# Patient Record
Sex: Female | Born: 1965
Health system: Southern US, Community
[De-identification: ages and names within clinical notes are randomized; demographics above are authoritative.]

## PROBLEM LIST (undated history)

## (undated) DIAGNOSIS — I10 Essential (primary) hypertension: Secondary | ICD-10-CM

## (undated) DIAGNOSIS — Z90710 Acquired absence of both cervix and uterus: Secondary | ICD-10-CM

## (undated) DIAGNOSIS — D649 Anemia, unspecified: Secondary | ICD-10-CM

## (undated) HISTORY — PX: ABDOMINAL HYSTERECTOMY: SHX81

## (undated) HISTORY — DX: Essential (primary) hypertension: I10

## (undated) HISTORY — DX: Acquired absence of both cervix and uterus: Z90.710

---

## 1997-09-26 ENCOUNTER — Encounter: Admission: RE | Admit: 1997-09-26 | Discharge: 1997-09-26 | Payer: Self-pay | Admitting: Family Medicine

## 1997-12-20 ENCOUNTER — Encounter: Admission: RE | Admit: 1997-12-20 | Discharge: 1997-12-20 | Payer: Self-pay | Admitting: Family Medicine

## 2006-09-22 ENCOUNTER — Ambulatory Visit: Payer: Self-pay | Admitting: Oncology

## 2006-09-25 LAB — CBC WITH DIFFERENTIAL (CANCER CENTER ONLY)
BASO#: 0 10*3/uL (ref 0.0–0.2)
Eosinophils Absolute: 0.1 10*3/uL (ref 0.0–0.5)
HGB: 8.4 g/dL — ABNORMAL LOW (ref 11.6–15.9)
LYMPH#: 2.6 10*3/uL (ref 0.9–3.3)
MONO#: 0.4 10*3/uL (ref 0.1–0.9)
NEUT#: 3.8 10*3/uL (ref 1.5–6.5)
RBC: 5 10*6/uL (ref 3.70–5.32)

## 2006-09-25 LAB — MORPHOLOGY - CHCC SATELLITE

## 2006-09-26 LAB — COMPREHENSIVE METABOLIC PANEL
AST: 22 U/L (ref 0–37)
Albumin: 3 g/dL — ABNORMAL LOW (ref 3.5–5.2)
BUN: 5 mg/dL — ABNORMAL LOW (ref 6–23)
Calcium: 8.5 mg/dL (ref 8.4–10.5)
Chloride: 102 mEq/L (ref 96–112)
Glucose, Bld: 128 mg/dL — ABNORMAL HIGH (ref 70–99)
Potassium: 3.3 mEq/L — ABNORMAL LOW (ref 3.5–5.3)

## 2006-09-26 LAB — FOLATE: Folate: 5.5 ng/mL

## 2006-09-26 LAB — IRON AND TIBC: UIBC: 488 ug/dL

## 2006-09-29 ENCOUNTER — Encounter (HOSPITAL_COMMUNITY): Admission: RE | Admit: 2006-09-29 | Discharge: 2006-12-09 | Payer: Self-pay | Admitting: Oncology

## 2006-09-29 LAB — BASIC METABOLIC PANEL - CANCER CENTER ONLY
BUN, Bld: 9 mg/dL (ref 7–22)
Chloride: 100 mEq/L (ref 98–108)
Potassium: 4.3 mEq/L (ref 3.3–4.7)

## 2006-09-30 LAB — TYPE & CROSSMATCH - CHCC SATELLITE

## 2006-10-08 LAB — BASIC METABOLIC PANEL - CANCER CENTER ONLY
BUN, Bld: 10 mg/dL (ref 7–22)
Calcium: 8.9 mg/dL (ref 8.0–10.3)
Chloride: 102 mEq/L (ref 98–108)
Creat: 0.5 mg/dl — ABNORMAL LOW (ref 0.6–1.2)

## 2006-10-08 LAB — CBC WITH DIFFERENTIAL (CANCER CENTER ONLY)
BASO#: 0 10*3/uL (ref 0.0–0.2)
Eosinophils Absolute: 0.2 10*3/uL (ref 0.0–0.5)
HGB: 11.1 g/dL — ABNORMAL LOW (ref 11.6–15.9)
LYMPH%: 30 % (ref 14.0–48.0)
MCH: 18.8 pg — ABNORMAL LOW (ref 26.0–34.0)
MCV: 61 fL — ABNORMAL LOW (ref 81–101)
MONO%: 5.6 % (ref 0.0–13.0)
RBC: 5.91 10*6/uL — ABNORMAL HIGH (ref 3.70–5.32)

## 2006-10-08 LAB — RETICULOCYTES (CHCC)
ABS Retic: 17.8 10*3/uL — ABNORMAL LOW (ref 19.0–186.0)
RBC.: 5.94 MIL/uL — ABNORMAL HIGH (ref 3.87–5.11)

## 2006-10-14 ENCOUNTER — Ambulatory Visit (HOSPITAL_COMMUNITY): Admission: RE | Admit: 2006-10-14 | Discharge: 2006-10-15 | Payer: Self-pay | Admitting: Obstetrics and Gynecology

## 2006-10-14 ENCOUNTER — Encounter (INDEPENDENT_AMBULATORY_CARE_PROVIDER_SITE_OTHER): Payer: Self-pay | Admitting: Specialist

## 2006-10-21 LAB — CBC WITH DIFFERENTIAL (CANCER CENTER ONLY)
Eosinophils Absolute: 0.3 10*3/uL (ref 0.0–0.5)
HCT: 34.9 % (ref 34.8–46.6)
LYMPH%: 30.8 % (ref 14.0–48.0)
MCH: 19 pg — ABNORMAL LOW (ref 26.0–34.0)
MCV: 61 fL — ABNORMAL LOW (ref 81–101)
MONO%: 8.1 % (ref 0.0–13.0)
NEUT%: 56.8 % (ref 39.6–80.0)
Platelets: 496 10*3/uL — ABNORMAL HIGH (ref 145–400)
RBC: 5.76 10*6/uL — ABNORMAL HIGH (ref 3.70–5.32)
RDW: 19.5 % — ABNORMAL HIGH (ref 10.5–14.6)

## 2006-10-21 LAB — IRON AND TIBC
%SAT: 6 % — ABNORMAL LOW (ref 20–55)
TIBC: 517 ug/dL — ABNORMAL HIGH (ref 250–470)

## 2006-11-07 ENCOUNTER — Ambulatory Visit: Payer: Self-pay | Admitting: Oncology

## 2006-11-25 LAB — CBC WITH DIFFERENTIAL (CANCER CENTER ONLY)
BASO%: 0.5 % (ref 0.0–2.0)
EOS%: 2.1 % (ref 0.0–7.0)
LYMPH#: 2.2 10*3/uL (ref 0.9–3.3)
MCHC: 31.3 g/dL — ABNORMAL LOW (ref 32.0–36.0)
MONO#: 0.6 10*3/uL (ref 0.1–0.9)
NEUT#: 4.3 10*3/uL (ref 1.5–6.5)
Platelets: 376 10*3/uL (ref 145–400)
RDW: 22.5 % — ABNORMAL HIGH (ref 10.5–14.6)
WBC: 7.3 10*3/uL (ref 3.9–10.0)

## 2006-12-22 ENCOUNTER — Ambulatory Visit: Payer: Self-pay | Admitting: Oncology

## 2007-01-06 ENCOUNTER — Encounter: Payer: Self-pay | Admitting: Internal Medicine

## 2007-01-06 LAB — CBC WITH DIFFERENTIAL (CANCER CENTER ONLY)
BASO#: 0 10*3/uL (ref 0.0–0.2)
BASO%: 0.4 % (ref 0.0–2.0)
EOS%: 2.7 % (ref 0.0–7.0)
HCT: 35.9 % (ref 34.8–46.6)
HGB: 11.3 g/dL — ABNORMAL LOW (ref 11.6–15.9)
LYMPH#: 2.3 10*3/uL (ref 0.9–3.3)
MCHC: 31.6 g/dL — ABNORMAL LOW (ref 32.0–36.0)
MONO#: 0.4 10*3/uL (ref 0.1–0.9)
NEUT#: 3.6 10*3/uL (ref 1.5–6.5)
NEUT%: 56 % (ref 39.6–80.0)
WBC: 6.4 10*3/uL (ref 3.9–10.0)

## 2007-01-06 LAB — FERRITIN: Ferritin: 191 ng/mL (ref 10–291)

## 2007-01-06 LAB — IRON AND TIBC
Iron: 110 ug/dL (ref 42–145)
TIBC: 368 ug/dL (ref 250–470)
UIBC: 258 ug/dL

## 2007-03-27 ENCOUNTER — Ambulatory Visit: Payer: Self-pay | Admitting: Oncology

## 2007-03-31 LAB — CBC WITH DIFFERENTIAL (CANCER CENTER ONLY)
BASO%: 0.4 % (ref 0.0–2.0)
EOS%: 2.8 % (ref 0.0–7.0)
LYMPH%: 39.5 % (ref 14.0–48.0)
MCH: 20 pg — ABNORMAL LOW (ref 26.0–34.0)
MCHC: 31.7 g/dL — ABNORMAL LOW (ref 32.0–36.0)
MCV: 63 fL — ABNORMAL LOW (ref 81–101)
MONO#: 0.4 10*3/uL (ref 0.1–0.9)
MONO%: 7.7 % (ref 0.0–13.0)
NEUT%: 49.6 % (ref 39.6–80.0)
Platelets: 340 10*3/uL (ref 145–400)
RDW: 15.5 % — ABNORMAL HIGH (ref 10.5–14.6)
WBC: 5.3 10*3/uL (ref 3.9–10.0)

## 2007-03-31 LAB — IRON AND TIBC
%SAT: 31 % (ref 20–55)
Iron: 114 ug/dL (ref 42–145)
UIBC: 252 ug/dL

## 2007-03-31 LAB — FERRITIN: Ferritin: 230 ng/mL (ref 10–291)

## 2007-06-17 ENCOUNTER — Ambulatory Visit: Payer: Self-pay | Admitting: Oncology

## 2007-06-23 LAB — CBC WITH DIFFERENTIAL (CANCER CENTER ONLY)
BASO#: 0 10*3/uL (ref 0.0–0.2)
BASO%: 0.5 % (ref 0.0–2.0)
HCT: 35.3 % (ref 34.8–46.6)
HGB: 11.1 g/dL — ABNORMAL LOW (ref 11.6–15.9)
LYMPH#: 2.7 10*3/uL (ref 0.9–3.3)
MONO#: 0.5 10*3/uL (ref 0.1–0.9)
NEUT#: 3.8 10*3/uL (ref 1.5–6.5)
NEUT%: 53.5 % (ref 39.6–80.0)
WBC: 7.2 10*3/uL (ref 3.9–10.0)

## 2007-06-23 LAB — FERRITIN: Ferritin: 169 ng/mL (ref 10–291)

## 2007-06-23 LAB — MORPHOLOGY - CHCC SATELLITE: PLT EST ~~LOC~~: ADEQUATE

## 2007-06-23 LAB — RETICULOCYTES (CHCC): Retic Ct Pct: 1.9 % (ref 0.4–3.1)

## 2007-06-23 LAB — IRON AND TIBC: Iron: 106 ug/dL (ref 42–145)

## 2007-06-25 LAB — HEMOGLOBINOPATHY EVALUATION
Hemoglobin Other: 0 % (ref 0.0–0.0)
Hgb F Quant: 0.4 % (ref 0.0–2.0)
Hgb S Quant: 0 % (ref 0.0–0.0)

## 2007-12-21 ENCOUNTER — Ambulatory Visit: Payer: Self-pay | Admitting: Oncology

## 2007-12-22 LAB — CBC WITH DIFFERENTIAL (CANCER CENTER ONLY)
BASO#: 0 10*3/uL (ref 0.0–0.2)
BASO%: 0.4 % (ref 0.0–2.0)
EOS%: 1.5 % (ref 0.0–7.0)
HGB: 10.6 g/dL — ABNORMAL LOW (ref 11.6–15.9)
LYMPH#: 1.8 10*3/uL (ref 0.9–3.3)
MCHC: 31.7 g/dL — ABNORMAL LOW (ref 32.0–36.0)
NEUT#: 3.5 10*3/uL (ref 1.5–6.5)
RDW: 16.2 % — ABNORMAL HIGH (ref 10.5–14.6)

## 2007-12-22 LAB — FERRITIN: Ferritin: 199 ng/mL (ref 10–291)

## 2007-12-22 LAB — IRON AND TIBC
%SAT: 33 % (ref 20–55)
TIBC: 314 ug/dL (ref 250–470)

## 2008-12-14 ENCOUNTER — Ambulatory Visit: Payer: Self-pay | Admitting: Oncology

## 2009-02-13 ENCOUNTER — Ambulatory Visit: Payer: Self-pay | Admitting: Oncology

## 2010-11-02 NOTE — Op Note (Signed)
NAMEMarland Grant  [name], [name] NO.:  [id number]   MEDICAL RECORD NO.:  [id number]          PATIENT TYPE:  OIB   LOCATION:  9308                          FACILITY:  [facility]   PHYSICIAN:  Maxie Better, M.D.DATE OF BIRTH:  22-May-1966   DATE OF PROCEDURE:  10/14/2006  DATE OF DISCHARGE:                               OPERATIVE REPORT   PREOPERATIVE DIAGNOSIS:  Menorrhagia, uterine fibroids, iron deficiency  anemia.   PROCEDURE:  Laparoscopic total hysterectomy.   POSTOPERATIVE DIAGNOSIS:  Menorrhagia, fibroid uterus, iron deficiency  anemia.   SURGEON:  Maxie Better, M.D.   ASSISTANT:  Gerri Spore B. Earlene Plater, M.D.   PROCEDURE:  Under adequate general anesthesia the patient is placed in  the dorsal lithotomy position.  She was sterilely prepped and draped in  usual fashion.  An indwelling Foley catheter was sterilely placed.  Examination under anesthesia revealed about a 8-10-week size uterus.  No  adnexal mass could be appreciated.  A weighted speculum placed in the  vagina.  Single tooth retractor was used anteriorly. Single-tooth  tenaculums placed on the anterior lip of the cervix.  The cervix sounded  to 8 cm. Cervix was then dilated up to #22 dilator.  A Rumi uterine  manipulator was then introduced into the uterine cavity in the usual  fashion and tenaculum removed.  Attention was then turned to the  abdomen. Quarter percent Marcaine was injected infraumbilically.  Infraumbilical incision was then made, carried down to the rectus  fascia.  Rectus fascia was opened bluntly and the parietal peritoneum  was entered bluntly and the pursestring 0 Vicryl placed on the fascia.  Hasson cannula was introduced and lighted video laparoscope was then  inserted after carbon dioxide was insufflated. The patient is placed in  Trendelenburg position. Two additional ports were then placed inferiorly  right and left. In the left lower in the left quadrant was 10-11 mm  scope  through an incision and 5 mm port was placed through the right  side. Using the lower ports the pelvis was inspected. Uterus was  globular with two pedunculated posterior fibroids.  Both ovaries were  normal. Both ureters were seen. There was slight bowel adhesions on the  left superior to the left adnexa. No other lesions and endometriosis  seen. Using the gyrus cutting forceps the round ligament, the utero-  ovarian ligaments were bilaterally cauterized.  The anterior leaf of  broad ligament was opened. The vesicouterine peritoneum was opened  transversely.  The bladder was sharply dissected off the uterine  segment.  The uterine vessels were cauterized after skeletonization with  the cone-tip traction and Rumi manipulator. The plasma spatula was then  utilized to open the vaginal cuff anteriorly and then the vaginal cuff  was then opened posteriorly. Continued cauterization of the uterine  vessels at that level was then performed and the uterus and cervix was  then severed from its vaginal attachment. The uterus was then brought  down into the vaginal area, however it needed to be morcellated in order  for it to be able to be passed through the vagina.  This was then  performed by removing the Rumi manipulator and uterus was removed in  pieces. The bigger portion of the uterus was then left in the vagina for  barrier. Attention was then turned again back to the abdominal process  and the vaginal cuff was closed with interrupted sutures of 0 Vicryl.  When the cuff was felt to be closed, the abdomen was irrigated,  suctioned, pedicles were inspected.  No bleeding noted and the lower  ports were removed. Abdomen was the deflated and the pursestring was  closed in the infraumbilical site and the skin incisions were  approximated with 4-0 Vicryl subcuticular stitch.  The specimen was also  removed from the vagina.  Specimen was uterus with cervix. Estimated blood loss was 100 mL.   Intraoperative fluid was 4 liters. Urine output was 300 mL clear yellow  urine.  Sponge and instrument counts x2 was correct.  Complications  none.  The patient tolerated procedure well and was transferred to  recovery in stable condition.      Maxie Better, M.D.  Electronically Signed     [place]/MED[facility]  D:  10/14/2006  T:  10/14/2006  Job:  [id number]

## 2011-04-22 ENCOUNTER — Other Ambulatory Visit (HOSPITAL_COMMUNITY): Payer: Self-pay | Admitting: Obstetrics and Gynecology

## 2011-05-10 ENCOUNTER — Other Ambulatory Visit (HOSPITAL_COMMUNITY): Payer: Self-pay | Admitting: Obstetrics and Gynecology

## 2011-05-10 DIAGNOSIS — Z1231 Encounter for screening mammogram for malignant neoplasm of breast: Secondary | ICD-10-CM

## 2011-06-13 ENCOUNTER — Ambulatory Visit (HOSPITAL_COMMUNITY)
Admission: RE | Admit: 2011-06-13 | Discharge: 2011-06-13 | Disposition: A | Discharge status: Home / Self Care | Payer: Self-pay | Source: Ambulatory Visit | Attending: Obstetrics and Gynecology | Admitting: Obstetrics and Gynecology

## 2011-06-13 DIAGNOSIS — Z1231 Encounter for screening mammogram for malignant neoplasm of breast: Secondary | ICD-10-CM

## 2011-06-25 ENCOUNTER — Other Ambulatory Visit: Payer: Self-pay | Admitting: Obstetrics and Gynecology

## 2011-06-25 DIAGNOSIS — R928 Other abnormal and inconclusive findings on diagnostic imaging of breast: Secondary | ICD-10-CM

## 2011-07-01 ENCOUNTER — Ambulatory Visit
Admission: RE | Admit: 2011-07-01 | Discharge: 2011-07-01 | Disposition: A | Discharge status: Home / Self Care | Payer: Self-pay | Source: Ambulatory Visit | Attending: Obstetrics and Gynecology | Admitting: Obstetrics and Gynecology

## 2011-07-01 DIAGNOSIS — R928 Other abnormal and inconclusive findings on diagnostic imaging of breast: Secondary | ICD-10-CM

## 2011-11-22 ENCOUNTER — Other Ambulatory Visit: Payer: Self-pay | Admitting: Obstetrics and Gynecology

## 2011-11-22 DIAGNOSIS — R921 Mammographic calcification found on diagnostic imaging of breast: Secondary | ICD-10-CM

## 2011-12-03 ENCOUNTER — Ambulatory Visit
Admission: RE | Admit: 2011-12-03 | Discharge: 2011-12-03 | Disposition: A | Discharge status: Home / Self Care | Payer: No Typology Code available for payment source | Source: Ambulatory Visit | Attending: Obstetrics and Gynecology | Admitting: Obstetrics and Gynecology

## 2011-12-03 ENCOUNTER — Ambulatory Visit
Admission: RE | Admit: 2011-12-03 | Discharge: 2011-12-03 | Disposition: A | Discharge status: Home / Self Care | Payer: No Typology Code available for payment source | Source: Ambulatory Visit

## 2011-12-03 ENCOUNTER — Other Ambulatory Visit: Payer: Self-pay | Admitting: Obstetrics and Gynecology

## 2011-12-03 DIAGNOSIS — R921 Mammographic calcification found on diagnostic imaging of breast: Secondary | ICD-10-CM

## 2011-12-31 ENCOUNTER — Encounter: Payer: Self-pay | Admitting: Family Medicine

## 2011-12-31 ENCOUNTER — Ambulatory Visit (INDEPENDENT_AMBULATORY_CARE_PROVIDER_SITE_OTHER): Payer: No Typology Code available for payment source | Admitting: Family Medicine

## 2011-12-31 VITALS — BP 116/78 | HR 70 | Temp 98.4°F | Ht 65.5 in | Wt 190.0 lb

## 2011-12-31 DIAGNOSIS — R61 Generalized hyperhidrosis: Secondary | ICD-10-CM | POA: Insufficient documentation

## 2011-12-31 DIAGNOSIS — I10 Essential (primary) hypertension: Secondary | ICD-10-CM | POA: Insufficient documentation

## 2011-12-31 DIAGNOSIS — L304 Erythema intertrigo: Secondary | ICD-10-CM | POA: Insufficient documentation

## 2011-12-31 DIAGNOSIS — L538 Other specified erythematous conditions: Secondary | ICD-10-CM

## 2011-12-31 MED ORDER — HYDROCHLOROTHIAZIDE 25 MG PO TABS: 25.0000 mg | ORAL_TABLET | Freq: Every day | ORAL | Status: DC

## 2011-12-31 MED ORDER — POTASSIUM CHLORIDE ER 10 MEQ PO TBCR: 10.0000 meq | EXTENDED_RELEASE_TABLET | Freq: Every day | ORAL | Status: DC

## 2011-12-31 MED ORDER — POTASSIUM CHLORIDE CRYS ER 10 MEQ PO TBCR: 10.0000 meq | EXTENDED_RELEASE_TABLET | Freq: Every day | ORAL | Status: DC

## 2011-12-31 NOTE — Assessment & Plan Note (Addendum)
Most likely due to menopause as pt still has her ovaries and experiences hot flashes during the day. No red flags (no fever, unexplained weight loss, cough, lymphadenopathy). Would have liked to get TSH, CBC/diff, and sed rate (threshold for action 60-70) today, but could not get labs due to financial concerns. If she is still experiencing these symptoms when she follows up in 3 months, will get the labs then as she should hopefully have [facility] card by then. Explained that if she gets any "red flag" symptoms before her 3 month follow up, she should return to clinic ASAP. She stated understanding.

## 2011-12-31 NOTE — Progress Notes (Addendum)
Patient ID: Kathryn Grant, female   DOB: 04-02-1966, 46 y.o.   MRN: [id number]   New patient visit today. Hx of hypertension, no other medical problems. She previously saw Dr. Maxie Better (OBGYN) and Dr. Della Goo as her PCP. Is changing doctors due to financial concerns.   HPI:  HYPERTENSION Takes HCTZ 25 mg for hypertension. No problems with medication. No chest pain, shortness of breath, vision changes, headaches, lightheadedness. Does get swelling in her left ankle if she uses salt on her food. Does not check her BP at home but does have a monitor. Needs refills on HCTZ and her of KCl that she takes every morning.  NIGHT SWEATS Have happened x1 month. No cough, fevers, or lumps on her body. Has intentionally lost weight over last few years, but no unexplained weight loss. Had hysterectomy in 2008 but still has ovaries. Does experience hot flashes during the day.  INTERTRIGO Occasionally gets "overheated" and feels sweaty and itchy under her breasts and on her sides around her underwear. Previous provider had given her clotrimazole/betamethasone and she has been applying this prn. Would like a refill. She does not have any symptoms right now.  ROS - See HPI  Social hx: nonsmoker, no alcohol   Health Maintenance: Has hx of abnormal mammogram, needs f/u diagnostic mammogram in January 2014.   EXAM: Gen: NAD, unusually pleasant Neck: No lymphadenopathy Heart: RRR Lungs: CTAB Abd: soft, nontender, nondistended, no masses Ext: no lower extremity edema, dorsal pedis pulses 2+ bilaterally Breasts: no inframammary redness or irritation present.

## 2011-12-31 NOTE — Assessment & Plan Note (Signed)
Not checking BP at home, but reading here in the office today is great. Will refill her HCTZ and KCl; desires a paper prescription. I have asked her to check and keep note of her blood pressures at home. She was hesitant when I mentioned possibly lowering her HCTZ dose if her blood pressures are good. Will continue with her current medications and f/u in 3 months. Needs to have BMET at that time; unable to get labs today due to financial concerns, is pursuing the [facility] card.

## 2011-12-31 NOTE — Assessment & Plan Note (Signed)
Occasionally has symptoms when she gets hot. Previously using clotrimazole/betamethasone cream. I explained that this is not a medication we would typically use for intertrigo, but suggested she try OTC 1% hydrocortisone cream, alternating with OTC clotrimazole cream. States understanding of this suggestion.

## 2011-12-31 NOTE — Progress Notes (Deleted)
Patient ID: Kathryn Grant, female   DOB: 20-Aug-1965, 46 y.o.   MRN: [id number]  New patient visit today. Hx of hypertension, no other medical problems. She previously saw Dr. Maxie Better (OBGYN) and Dr. Della Goo as her PCP. Is changing doctors due to financial concerns.  Social hx: nonsmoker, no alcohol  Has hx of abnormal mammogram, needs f/u diagnostic mammogram in January 2014.

## 2011-12-31 NOTE — Patient Instructions (Signed)
It was nice to meet you today. I have given you a prescription for your potassium and hydrochlorothiazide.  For the area under your breast, you can apply: 1% hydrocortisone cream Clotrimazole cream (the same kind used for a vaginal yeast infection) You can alternate these creams every 6 hours. These are available over the counter.  I think your night sweats are likely due to menopause. If you experience any unexplained weight loss, fevers, lumps, or cough that does not go away, I want you to come back to see me as soon as you can.  Make a follow up appointment for 3 months from now, at which time we will check your blood work.

## 2012-05-22 ENCOUNTER — Other Ambulatory Visit: Payer: Self-pay | Admitting: Obstetrics and Gynecology

## 2012-05-22 DIAGNOSIS — R921 Mammographic calcification found on diagnostic imaging of breast: Secondary | ICD-10-CM

## 2012-06-11 ENCOUNTER — Ambulatory Visit
Admission: RE | Admit: 2012-06-11 | Discharge: 2012-06-11 | Disposition: A | Discharge status: Home / Self Care | Payer: Self-pay | Source: Ambulatory Visit | Attending: Obstetrics and Gynecology | Admitting: Obstetrics and Gynecology

## 2012-06-11 ENCOUNTER — Other Ambulatory Visit: Payer: Self-pay | Admitting: Obstetrics and Gynecology

## 2012-06-11 DIAGNOSIS — R921 Mammographic calcification found on diagnostic imaging of breast: Secondary | ICD-10-CM

## 2012-06-24 ENCOUNTER — Encounter (HOSPITAL_COMMUNITY): Payer: Self-pay | Admitting: *Deleted

## 2012-06-30 ENCOUNTER — Ambulatory Visit (INDEPENDENT_AMBULATORY_CARE_PROVIDER_SITE_OTHER): Payer: No Typology Code available for payment source | Admitting: Family Medicine

## 2012-06-30 ENCOUNTER — Encounter (HOSPITAL_COMMUNITY): Payer: Self-pay

## 2012-06-30 ENCOUNTER — Other Ambulatory Visit: Payer: Self-pay | Admitting: Obstetrics and Gynecology

## 2012-06-30 ENCOUNTER — Ambulatory Visit (HOSPITAL_COMMUNITY)
Admission: RE | Admit: 2012-06-30 | Discharge: 2012-06-30 | Disposition: A | Discharge status: Home / Self Care | Payer: No Typology Code available for payment source | Source: Ambulatory Visit | Attending: Obstetrics and Gynecology | Admitting: Obstetrics and Gynecology

## 2012-06-30 VITALS — BP 105/71 | HR 79 | Ht 65.5 in | Wt 190.0 lb

## 2012-06-30 VITALS — BP 108/64 | Temp 98.6°F | Ht 64.0 in | Wt 190.6 lb

## 2012-06-30 DIAGNOSIS — Z1239 Encounter for other screening for malignant neoplasm of breast: Secondary | ICD-10-CM

## 2012-06-30 DIAGNOSIS — Z0000 Encounter for general adult medical examination without abnormal findings: Secondary | ICD-10-CM

## 2012-06-30 DIAGNOSIS — I10 Essential (primary) hypertension: Secondary | ICD-10-CM

## 2012-06-30 DIAGNOSIS — R921 Mammographic calcification found on diagnostic imaging of breast: Secondary | ICD-10-CM

## 2012-06-30 DIAGNOSIS — R011 Cardiac murmur, unspecified: Secondary | ICD-10-CM

## 2012-06-30 DIAGNOSIS — N6320 Unspecified lump in the left breast, unspecified quadrant: Secondary | ICD-10-CM | POA: Insufficient documentation

## 2012-06-30 DIAGNOSIS — Z862 Personal history of diseases of the blood and blood-forming organs and certain disorders involving the immune mechanism: Secondary | ICD-10-CM | POA: Insufficient documentation

## 2012-06-30 HISTORY — DX: Anemia, unspecified: D64.9

## 2012-06-30 LAB — CBC
HCT: 33 % — ABNORMAL LOW (ref 36.0–46.0)
MCHC: 30.9 g/dL (ref 30.0–36.0)
RDW: 17.6 % — ABNORMAL HIGH (ref 11.5–15.5)

## 2012-06-30 LAB — BASIC METABOLIC PANEL
BUN: 12 mg/dL (ref 6–23)
Calcium: 8.9 mg/dL (ref 8.4–10.5)
Glucose, Bld: 85 mg/dL (ref 70–99)

## 2012-06-30 LAB — LDL CHOLESTEROL, DIRECT: Direct LDL: 66 mg/dL

## 2012-06-30 MED ORDER — POTASSIUM CHLORIDE CRYS ER 10 MEQ PO TBCR: 10.0000 meq | EXTENDED_RELEASE_TABLET | Freq: Every day | ORAL | Status: DC

## 2012-06-30 MED ORDER — HYDROCHLOROTHIAZIDE 25 MG PO TABS: 25.0000 mg | ORAL_TABLET | Freq: Every day | ORAL | Status: DC

## 2012-06-30 NOTE — Progress Notes (Signed)
Patient ID: Bascom Levels, female   DOB: [birth year], 47 y.o.   MRN: [id number]  CC: Carlei Huang is a 47 y.o. female here to obtain medication refills and follow up on her HTN.   HPI:  Hypertension: Currently taking HCTZ 25mg  and potassium daily. Pt states she is doing well. No problems with her medications. She does not check her blood pressure at home. Denies chest pain, shortness of breath, lower extremity edema, lightheadedness, or headache. No history of heart disease.  Last time I saw her, she complained of night sweats, but these have stopped.  ROS: See HPI   PHYSICAL EXAM: BP 105/71  Pulse 79  Ht 5' 5.5" (1.664 m)  Wt 190 lb (86.183 kg)  BMI 31.14 kg/m2 Gen: NAD, pleasant Heart: RRR, 2/6 systolic murmur loudest at lower sternal border, decreases in volume with sitting up, PMI barely palpable but seems in right place Lungs: CTAB, NWOB Neuro: nonfocal, speech intact Ext: 2+ DP bilaterally, no pitting edema

## 2012-06-30 NOTE — Patient Instructions (Signed)
Taught patient how to perform BSE and gave educational materials to take home. Patient did not need a Pap smear today due to last Pap smear was December 2013 per patient. Patient has had a hysterectomy for benign reasons. Let patient know that she would not need any further Pap smears per BCCCP and ACOG guidelines. Patient referred to the [facility] for left breast biopsy per recommendation. Appointment scheduled for Thursday, July 09, 2012 at 1415. Patient aware of appointment and will be there. Patient verbalized understanding.

## 2012-06-30 NOTE — Patient Instructions (Addendum)
It was great to see you again today! I'm glad you are feeling well.  I have refilled your blood pressure medicine (HCTZ) and the potassium.  Schedule a follow up visit in 6 months, or sooner if needed.  I will call you if your test results are not normal.  Otherwise, I will send you a letter.  If you do not hear from me with in 2 weeks please call our office.     -Dr. Pollie Meyer

## 2012-06-30 NOTE — Progress Notes (Signed)
Patient referred to [facility] by the [facility] due to recommending left breast biopsy. Diagnostic mammogram completed at the [facility] 06/11/2012.  Pap Smear:    Pap smear not completed today. Per patient last Pap smear was December 2013 at [facility]. Per patient no history of abnormal Pap smears. Patient has a history of a hysterectomy in 2008 for fibroids. No further Pap smears are needed due to patients history of a hysterectomy for benign reasons per ACOG and BCCCP guidelines. No Pap smear results in EPIC.  Physical exam: Breasts Breasts symmetrical. No skin abnormalities bilateral breasts. No nipple retraction bilateral breasts. No nipple discharge bilateral breasts. No lymphadenopathy. No lumps palpated right breast. Palpated a lump in the left upper outer quadrant of breast. Patient complained of tenderness when palpated lump. Patient referred to the [facility] for left breast biopsy per recommendation. Appointment scheduled for Thursday, July 09, 2012 at 1415.      Pelvic/Bimanual No Pap smear completed today since last Pap smear was December 2013 per patient and patient has had a history of a hysterectomy for benign reasons. Pap smear not indicated per BCCCP guidelines.

## 2012-07-01 DIAGNOSIS — R011 Cardiac murmur, unspecified: Secondary | ICD-10-CM | POA: Insufficient documentation

## 2012-07-01 DIAGNOSIS — Z0000 Encounter for general adult medical examination without abnormal findings: Secondary | ICD-10-CM | POA: Insufficient documentation

## 2012-07-01 NOTE — Assessment & Plan Note (Addendum)
Mammogram - has abnormal mammogram and is currently being worked up for breast lump, biopsy pending Pap smear - has had hysterectomy Lipids - will obtain today since it has been several years since last were obtained.

## 2012-07-01 NOTE — Assessment & Plan Note (Addendum)
BP well controlled. Continue HCTZ and potassium. Check BMET today. F/u in 6 months.

## 2012-07-01 NOTE — Assessment & Plan Note (Signed)
Noted on prior labs - will obtain CBC today since we are getting blood, to see where she is.

## 2012-07-01 NOTE — Assessment & Plan Note (Addendum)
Auscultated on exam today, and pt denies ever being told she had a heart murmur. Pt seen and evaluated with Dr. Deirdre Priest for murmur. The murmur is less pronounced with sitting, likely a flow murmur. Will not pursue workup at this time as pt is asymptomatic, has no hx of heart disease, and this is likely a benign. Continue to monitor at future visits, will workup if murmur worsens or pt becomes symptomatic.

## 2012-07-09 ENCOUNTER — Other Ambulatory Visit: Payer: Self-pay | Admitting: Diagnostic Radiology

## 2012-07-09 ENCOUNTER — Encounter: Payer: Self-pay | Admitting: Family Medicine

## 2012-07-09 ENCOUNTER — Ambulatory Visit
Admission: RE | Admit: 2012-07-09 | Discharge: 2012-07-09 | Disposition: A | Discharge status: Home / Self Care | Payer: No Typology Code available for payment source | Source: Ambulatory Visit | Attending: Obstetrics and Gynecology | Admitting: Obstetrics and Gynecology

## 2012-07-09 ENCOUNTER — Telehealth: Payer: Self-pay | Admitting: Family Medicine

## 2012-07-09 DIAGNOSIS — R921 Mammographic calcification found on diagnostic imaging of breast: Secondary | ICD-10-CM

## 2012-07-09 NOTE — Telephone Encounter (Signed)
Called pt back - see my other note for the full details of both conversations (it was on the same topic)

## 2012-07-09 NOTE — Telephone Encounter (Signed)
Pt is asking to speak to Dr Pollie Meyer asap

## 2012-07-09 NOTE — Telephone Encounter (Addendum)
Called patient to discuss CBC results (hgb low at 10.2, MCV 64). From looking back in her records she has been anemic in the past. Had hgb electrophoresis done which was relatively normal.   Pt reports that back in 2008 she was seen by a provider in the [place] area and received 22 doses of injectable iron for her anemia. Says she has had low blood counts her whole life. She reports a hx of weight loss (260lb to 190 lb) but she says she was trying to lose weight to prepare for her hysterectomy; also attributes weight loss to having stopped OCPs. Weight has been stable since her visit with me in July 2013. Denies having blood in her stool, or dark tarry stools. She feels well and is not fatigued, reports she has "energy to burn". She walks a lot for health.  Advised that we could check iron studies again and see if she should be on iron, but she would prefer not to do this as she has been feeling well and this has been a chronic problem for her. She will come in to be seen if she has any rectal bleeding or melena. Otherwise I will see her for a f/u visit in 6 months.  She has a breast biopsy scheduled for later today, as part of a workup for a breast mass. She will have them copy me on her results.

## 2012-08-03 ENCOUNTER — Telehealth: Payer: Self-pay | Admitting: Family Medicine

## 2012-08-03 DIAGNOSIS — I10 Essential (primary) hypertension: Secondary | ICD-10-CM

## 2012-08-03 MED ORDER — HYDROCHLOROTHIAZIDE 25 MG PO TABS: 25.0000 mg | ORAL_TABLET | Freq: Every day | ORAL | Status: DC

## 2012-08-03 MED ORDER — POTASSIUM CHLORIDE CRYS ER 10 MEQ PO TBCR: 10.0000 meq | EXTENDED_RELEASE_TABLET | Freq: Every day | ORAL | Status: DC

## 2012-08-03 NOTE — Telephone Encounter (Signed)
Not sure why she needed refills as I gave her plenty at her OV in January, but okay to do these refills now.

## 2012-08-03 NOTE — Telephone Encounter (Signed)
Called pt. Pt request refills on her HTN meds. She currently has no insurance and is waiting on the 'orange card'.  Refilled pt's meds. Lorenda Hatchet, Renato Battles

## 2012-08-03 NOTE — Telephone Encounter (Signed)
Patient would like to speak with Dr. Pollie Meyer about prescriptions that she is currently taking.

## 2012-12-07 ENCOUNTER — Other Ambulatory Visit: Payer: Self-pay | Admitting: Obstetrics and Gynecology

## 2012-12-07 DIAGNOSIS — R921 Mammographic calcification found on diagnostic imaging of breast: Secondary | ICD-10-CM

## 2012-12-11 ENCOUNTER — Telehealth (HOSPITAL_COMMUNITY): Payer: Self-pay | Admitting: *Deleted

## 2012-12-11 NOTE — Telephone Encounter (Signed)
Patient returned call. Advised patient was time to follow up for diagnostic mammogram. Patient states tried to schedule at [facility] but was told to call after July 23. Patient will call to schedule.

## 2012-12-11 NOTE — Telephone Encounter (Signed)
Telephoned patient at home # and left message to return call to BCCCP 

## 2013-01-13 ENCOUNTER — Ambulatory Visit
Admission: RE | Admit: 2013-01-13 | Discharge: 2013-01-13 | Disposition: A | Discharge status: Home / Self Care | Payer: No Typology Code available for payment source | Source: Ambulatory Visit | Attending: Obstetrics and Gynecology | Admitting: Obstetrics and Gynecology

## 2013-01-13 DIAGNOSIS — R921 Mammographic calcification found on diagnostic imaging of breast: Secondary | ICD-10-CM

## 2013-01-27 ENCOUNTER — Encounter: Payer: Self-pay | Admitting: Family Medicine

## 2013-01-27 ENCOUNTER — Ambulatory Visit (INDEPENDENT_AMBULATORY_CARE_PROVIDER_SITE_OTHER): Payer: No Typology Code available for payment source | Admitting: Family Medicine

## 2013-01-27 VITALS — BP 109/76 | HR 70 | Temp 98.2°F | Ht 65.0 in | Wt 183.0 lb

## 2013-01-27 DIAGNOSIS — I10 Essential (primary) hypertension: Secondary | ICD-10-CM

## 2013-01-27 DIAGNOSIS — Z111 Encounter for screening for respiratory tuberculosis: Secondary | ICD-10-CM

## 2013-01-27 MED ORDER — HYDROCHLOROTHIAZIDE 25 MG PO TABS: 25.0000 mg | ORAL_TABLET | Freq: Every day | ORAL | Status: DC

## 2013-01-27 MED ORDER — POTASSIUM CHLORIDE CRYS ER 10 MEQ PO TBCR: 10.0000 meq | EXTENDED_RELEASE_TABLET | Freq: Every day | ORAL | Status: DC

## 2013-01-27 MED ORDER — GENTAMICIN SULFATE 0.1 % EX OINT: TOPICAL_OINTMENT | Freq: Three times a day (TID) | CUTANEOUS | Status: DC

## 2013-01-27 NOTE — Progress Notes (Signed)
Patient ID: Kathryn Grant, female   DOB: 1966/03/27, 47 y.o.   MRN: [id number]  HPI:  Boils: - occur in same spot every so often, in gluteal cleft and on mons pubis. Maybe comes every month. No fevers. They do drain and then come down. They've been coming and going for about 2-3 months now.  Sores on stomach: recently noticed them. The last one popped up on Sunday. Had been cleaning with peroxide and neosporin. It blew up like a blister, and drained clear liquid. Did not injure herself.  HTN: Currently taking HCTZ 25mg  daily and potassium 10 mEq daily. No CP or SOB, no headaches or swelling in legs. Denies lightheadedness and dizziness as well. Does a lot of walking and not trying to gain weight.  Patient also had additional questions, including how often bowels need to be "cleaned out" - advised that as long as she is not having discomfort and is having regular BM's then there is no definite answer to this. She has BM every day or every other day. 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 Is not aware of there being an occupational health department there.  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  PHYSICAL EXAM: BP 109/76  Pulse 70  Temp(Src) 98.2 F (36.8 C) (Oral)  Ht 5\' 5"  (1.651 m)  Wt 183 lb (83.008 kg)  BMI 30.45 kg/m2 Gen: NAD Heart: RRR Lungs: CTAB Skin: quarter-sized area of induration present on left side of gluteal cleft. No fluctuance. Tender to palpation. Two similar areas present on mons pubis, with less induration. Three oval shaped skin lesions present on skin overlying stomach, inferior to umbilicus and slightly to the right. One appears more acute with some skin breakdown and surrounding erythema, the other is well healed. Skin discoloration present overlying this lesion, in the shape of a bandaid. The third lesion is more chronic and appears to be old ecchymosis.  Neuro:  nonfocal grossly, speech intact. Somewhat difficulty to get clear history from her as she doesn't understand the questions easily.  ASSESSMENT/PLAN:  # Health maintenance:  -needs mammogram in December 2014 for f/u of abnormal mammogram  # Occupational health: -advised that she needs to speak with her employer regarding what testing or shots she needs to work there. If she works at a healthcare facility then they should have all of that information available to her. -pt would like TB test today, so will have PPD placed. She will return on Friday to have it read.  # Sores on abdomen: -unclear etiology as patient does not recall injuring her abdomen. no overt superinfection, however given erythema will rx topical antibiotic ointment for use. Pt has no rx coverage, so will give gentamicin ointment, which is on the $4.00 list. precepted with Dr. Lum Babe who also examined patient and agrees with this plan.  # Boils on bottom: - groin area appears to be ingrown hairs - buttock boils possibly some mild hidradenitis. No signs of infection or need for drainage today. Will start with recommending good hygeine and weight loss. F/u in 1-2 months if not improved. Precepted with Dr. Lum Babe who also examined patient and agrees with this plan.  # See also problem based charting for HTN

## 2013-01-27 NOTE — Patient Instructions (Addendum)
It was great to see you again today!  I refilled your blood pressure medicine.  Try using the gentamicin ointment that I sent in to the pharmacy. It should be $4.00. Put this on the stomach sores.   For the spots on your bottom and genital area, use good hygiene. Weight loss should also be helpful for this. Come back if not improved in 1-2 months and we can talk about other treatment options.  Otherwise I will see you back in 6 months to follow up on your blood pressure.  Be well, Dr. Pollie Meyer

## 2013-01-29 ENCOUNTER — Ambulatory Visit: Payer: No Typology Code available for payment source | Admitting: *Deleted

## 2013-01-29 ENCOUNTER — Encounter: Payer: Self-pay | Admitting: *Deleted

## 2013-01-29 DIAGNOSIS — Z111 Encounter for screening for respiratory tuberculosis: Secondary | ICD-10-CM

## 2013-01-29 LAB — TB SKIN TEST: Induration: 0 mm

## 2013-01-29 NOTE — Progress Notes (Signed)
PPD Reading Note PPD read and results entered in EpicCare. Result: 0 mm induration. Interpretation: negative Letter given regarding results. Elizabeth Stephanye Finnicum, RN-BSN  

## 2013-02-04 NOTE — Assessment & Plan Note (Signed)
Well controlled. Continue current management. F/u in 6 months.

## 2013-03-16 ENCOUNTER — Ambulatory Visit: Payer: Self-pay

## 2013-03-23 ENCOUNTER — Ambulatory Visit: Payer: Self-pay

## 2013-06-18 ENCOUNTER — Other Ambulatory Visit: Payer: Self-pay | Admitting: Family Medicine

## 2013-06-18 ENCOUNTER — Other Ambulatory Visit: Payer: Self-pay | Admitting: Obstetrics and Gynecology

## 2013-06-18 DIAGNOSIS — R921 Mammographic calcification found on diagnostic imaging of breast: Secondary | ICD-10-CM

## 2013-07-05 ENCOUNTER — Other Ambulatory Visit (HOSPITAL_COMMUNITY): Payer: Self-pay | Admitting: *Deleted

## 2013-07-05 DIAGNOSIS — R921 Mammographic calcification found on diagnostic imaging of breast: Secondary | ICD-10-CM

## 2013-07-06 ENCOUNTER — Encounter (HOSPITAL_COMMUNITY): Payer: Self-pay

## 2013-07-06 ENCOUNTER — Encounter (INDEPENDENT_AMBULATORY_CARE_PROVIDER_SITE_OTHER): Payer: Self-pay

## 2013-07-06 ENCOUNTER — Ambulatory Visit (HOSPITAL_COMMUNITY)
Admission: RE | Admit: 2013-07-06 | Discharge: 2013-07-06 | Disposition: A | Discharge status: Home / Self Care | Payer: No Typology Code available for payment source | Source: Ambulatory Visit | Attending: Obstetrics and Gynecology | Admitting: Obstetrics and Gynecology

## 2013-07-06 ENCOUNTER — Ambulatory Visit: Payer: No Typology Code available for payment source

## 2013-07-06 VITALS — BP 112/60 | Temp 98.5°F | Ht 66.0 in | Wt 183.4 lb

## 2013-07-06 DIAGNOSIS — Z1239 Encounter for other screening for malignant neoplasm of breast: Secondary | ICD-10-CM

## 2013-07-06 NOTE — Progress Notes (Signed)
Patient referred to BCCCP due to 5 month bilateral diagnostic mammogram recommended and patient needed to renew BCCCP.  Pap Smear:    Pap smear not completed today. Per patient last Pap smear was December 2013 at [facility]. Per patient no history of abnormal Pap smears. Patient has a history of a hysterectomy in 2008 for fibroids. No further Pap smears are needed due to patients history of a hysterectomy for benign reasons per ACOG and BCCCP guidelines. No Pap smear results in EPIC.  Physical exam: Breasts Breasts symmetrical. No skin abnormalities bilateral breasts. No nipple retraction bilateral breasts. No nipple discharge bilateral breasts. No lymphadenopathy. No lumps palpated bilateral breasts. No complaints of pain or tenderness on exam. [facility] mamm[facility] per recommendation. Appointment scheduled for Wednesday, July 14, 2013 at 0855.   Pelvic/Bimanual No Pap smear completed today since last Pap smear was December 2013 per patient and patient has had a history of a hysterectomy for benign reasons. Pap smear not indicated per BCCCP guidelines.

## 2013-07-06 NOTE — Patient Instructions (Signed)
Taught Kathryn LevelsDenise Grant how to perform BSE and gave educational materials to take home. Patient did not need a Pap smear today due to last Pap smear was December 2013 per patient. Patient has had a hysterectomy for benign reasons. Reminded patient that she would not need any further Pap smears per BCCCP and ACOG guidelines. [facility] for bilateral diagnostic mamm[facility] per recommendation. Appointment scheduled for Wednesday, July 14, 2013 at 0855. Patient aware of appointment and will be there. Kathryn Levelsenise Grant verbalized understanding.  Averianna Brugger, Kathaleen Maserhristine Poll, RN 10:49 AM

## 2013-07-12 ENCOUNTER — Ambulatory Visit: Payer: No Typology Code available for payment source

## 2013-07-13 ENCOUNTER — Other Ambulatory Visit: Payer: No Typology Code available for payment source

## 2013-07-14 ENCOUNTER — Ambulatory Visit
Admission: RE | Admit: 2013-07-14 | Discharge: 2013-07-14 | Disposition: A | Discharge status: Home / Self Care | Payer: No Typology Code available for payment source | Source: Ambulatory Visit | Attending: Obstetrics and Gynecology | Admitting: Obstetrics and Gynecology

## 2013-07-14 DIAGNOSIS — R921 Mammographic calcification found on diagnostic imaging of breast: Secondary | ICD-10-CM

## 2013-12-09 ENCOUNTER — Ambulatory Visit: Payer: Self-pay

## 2013-12-09 ENCOUNTER — Ambulatory Visit (INDEPENDENT_AMBULATORY_CARE_PROVIDER_SITE_OTHER): Payer: No Typology Code available for payment source | Admitting: Family Medicine

## 2013-12-09 ENCOUNTER — Encounter: Payer: Self-pay | Admitting: Family Medicine

## 2013-12-09 VITALS — BP 109/74 | HR 77 | Ht 66.0 in | Wt 187.0 lb

## 2013-12-09 DIAGNOSIS — I10 Essential (primary) hypertension: Secondary | ICD-10-CM

## 2013-12-09 DIAGNOSIS — Z23 Encounter for immunization: Secondary | ICD-10-CM

## 2013-12-09 LAB — LIPID PANEL
CHOL/HDL RATIO: 2.9 ratio
Cholesterol: 144 mg/dL (ref 0–200)
HDL: 50 mg/dL (ref >39)
LDL CALC: 85 mg/dL (ref 0–99)
Triglycerides: 43 mg/dL (ref <150)
VLDL: 9 mg/dL (ref 0–40)

## 2013-12-09 LAB — COMPREHENSIVE METABOLIC PANEL
ALBUMIN: 4.2 g/dL (ref 3.5–5.2)
ALK PHOS: 70 U/L (ref 39–117)
ALT: 12 U/L (ref 0–35)
AST: 12 U/L (ref 0–37)
BUN: 13 mg/dL (ref 6–23)
CO2: 27 mEq/L (ref 19–32)
CREATININE: 0.65 mg/dL (ref 0.50–1.10)
Calcium: 9.4 mg/dL (ref 8.4–10.5)
Chloride: 103 mEq/L (ref 96–112)
Glucose, Bld: 87 mg/dL (ref 70–99)
POTASSIUM: 4.1 meq/L (ref 3.5–5.3)
Sodium: 136 mEq/L (ref 135–145)
Total Bilirubin: 0.5 mg/dL (ref 0.2–1.2)
Total Protein: 7 g/dL (ref 6.0–8.3)

## 2013-12-09 MED ORDER — POTASSIUM CHLORIDE CRYS ER 10 MEQ PO TBCR: 10.0000 meq | EXTENDED_RELEASE_TABLET | Freq: Every day | ORAL | Status: DC

## 2013-12-09 MED ORDER — HYDROCHLOROTHIAZIDE 25 MG PO TABS: 25.0000 mg | ORAL_TABLET | Freq: Every day | ORAL | Status: DC

## 2013-12-09 NOTE — Patient Instructions (Signed)
It was great to see you again today!  I refilled your medicines. I will call you if your test results are not normal.  Otherwise, I will send you a letter.  If you do not hear from me with in 2 weeks please call our office.      Follow up in 6 months for your blood pressure, or sooner if you have any problems.  Be well, Dr. Pollie MeyerMcIntyre

## 2013-12-09 NOTE — Assessment & Plan Note (Signed)
Well-controlled. Continue current regimen. Check electrolytes and renal function today. Followup in 6 months.

## 2013-12-09 NOTE — Progress Notes (Signed)
Patient ID: Kathryn Grant, female   DOB: 01-Oct-1965, 48 y.o.   MRN: [id number]  HPI:  Hypertension: Currently taking HCTZ 25 mg daily. Also takes potassium 10 mEq daily. She does not check her blood pressure at home. She denies any chest pain, shortness of breath, syncope, or lower extremity edema. Also denies any lightheadedness or dizziness.  Skin lesions on stomach: We treated this at her last visit with gentamicin ointment. She has not had to use this in many months. Feels that her stomach has felt well. It does not bother her at this time.  ROS: See HPI  PMFSH: History of hypertension  PHYSICAL EXAM: BP 109/74  Pulse 77  Ht 5\' 6"  (1.676 m)  Wt 187 lb (84.823 kg)  BMI 30.20 kg/m2 Gen: NAD, pleasant, cooperative HEENT: NCAT Heart: RRR, no murmurs Lungs: CTAB, NWOB Skin: Areas of previous skin lesions on stomach are well-healed. No surrounding erythema, skin breakdown, or drainage. Nontender to palpation. Neuro: grossly nonfocal, speech normal, follows commands, alert and oriented Ext: No appreciable lower extremity edema bilaterally   ASSESSMENT/PLAN:  # Health maintenance:  -Check lipid panel today to screen for hyperlipidemia  # Skin lesions: These appear well-healed. Not currently a problem. Follow up as needed.  See problem based charting for additional assessment/plan.  FOLLOW UP: F/u in 6 months for hypertension.  GrenadaBrittany J. Pollie MeyerMcIntyre, MD [facility]

## 2013-12-10 ENCOUNTER — Encounter: Payer: Self-pay | Admitting: Family Medicine

## 2014-01-07 ENCOUNTER — Ambulatory Visit: Payer: No Typology Code available for payment source

## 2014-04-18 ENCOUNTER — Encounter: Payer: Self-pay | Admitting: Family Medicine

## 2014-07-12 ENCOUNTER — Ambulatory Visit: Payer: Self-pay

## 2014-07-18 ENCOUNTER — Other Ambulatory Visit: Payer: Self-pay | Admitting: Obstetrics and Gynecology

## 2014-07-18 DIAGNOSIS — Z1231 Encounter for screening mammogram for malignant neoplasm of breast: Secondary | ICD-10-CM

## 2014-07-19 ENCOUNTER — Ambulatory Visit: Payer: Self-pay

## 2014-07-25 ENCOUNTER — Ambulatory Visit: Payer: Self-pay

## 2014-08-04 ENCOUNTER — Ambulatory Visit (HOSPITAL_COMMUNITY)
Admission: RE | Admit: 2014-08-04 | Discharge: 2014-08-04 | Disposition: A | Discharge status: Home / Self Care | Payer: Self-pay | Source: Ambulatory Visit | Attending: Obstetrics and Gynecology | Admitting: Obstetrics and Gynecology

## 2014-08-04 ENCOUNTER — Encounter (HOSPITAL_COMMUNITY): Payer: Self-pay

## 2014-08-04 VITALS — BP 112/72 | Temp 97.9°F | Ht 66.0 in | Wt 199.2 lb

## 2014-08-04 DIAGNOSIS — Z1239 Encounter for other screening for malignant neoplasm of breast: Secondary | ICD-10-CM

## 2014-08-04 DIAGNOSIS — Z1231 Encounter for screening mammogram for malignant neoplasm of breast: Secondary | ICD-10-CM

## 2014-08-04 NOTE — Progress Notes (Signed)
No complaints today.  Pap Smear:  Pap smear not completed today. Per patient last Pap smear was December 2013 at [facility]. Per patient no history of abnormal Pap smears. Patient has a history of a hysterectomy in 2008 for fibroids. No further Pap smears are needed due to patients history of a hysterectomy for benign reasons per ACOG and BCCCP guidelines. No Pap smear results in EPIC.  Physical exam: Breasts Breasts symmetrical. No skin abnormalities bilateral breasts. No nipple retraction bilateral breasts. No nipple discharge bilateral breasts. No lymphadenopathy. No lumps palpated bilateral breasts. No complaints of pain or tenderness on exam. Patient escorted to mammography for a screening mammogram.        Pelvic/Bimanual No Pap smear completed today since last Pap smear was December 2013 per patient and patient has had a history of a hysterectomy for benign reasons. Pap smear not indicated per BCCCP guidelines.

## 2014-08-04 NOTE — Patient Instructions (Signed)
Education materials given about self breast awareness. Explained to Kathryn Grant that she did not need a Pap smear today due to last Pap smear was in December 2013 and patient has a history of a hysterectomy for benign reasons. Informed patient that she no longer needs Pap smears due to her history of a hysterectomy for benign reasons. Let [facility] know [facility] will follow up with her within the next couple weeks with results by letter or phone. Kathryn Levelsenise Grant verbalized understanding. Escorted patient to mammography for a screening mammogram.  Efton Thomley, Kathaleen Maserhristine Poll, RN 12:40 PM

## 2014-12-02 ENCOUNTER — Encounter: Payer: Self-pay | Admitting: Family Medicine

## 2014-12-02 ENCOUNTER — Ambulatory Visit (INDEPENDENT_AMBULATORY_CARE_PROVIDER_SITE_OTHER): Payer: 59 | Admitting: Family Medicine

## 2014-12-02 VITALS — BP 114/65 | HR 79 | Temp 98.0°F | Ht 66.0 in | Wt 195.8 lb

## 2014-12-02 DIAGNOSIS — R21 Rash and other nonspecific skin eruption: Secondary | ICD-10-CM

## 2014-12-02 DIAGNOSIS — Z114 Encounter for screening for human immunodeficiency virus [HIV]: Secondary | ICD-10-CM | POA: Diagnosis not present

## 2014-12-02 DIAGNOSIS — I10 Essential (primary) hypertension: Secondary | ICD-10-CM | POA: Diagnosis not present

## 2014-12-02 LAB — BASIC METABOLIC PANEL
BUN: 10 mg/dL (ref 6–23)
CO2: 26 mEq/L (ref 19–32)
Calcium: 9.1 mg/dL (ref 8.4–10.5)
Chloride: 101 mEq/L (ref 96–112)
Creat: 0.59 mg/dL (ref 0.50–1.10)
GLUCOSE: 83 mg/dL (ref 70–99)
POTASSIUM: 4.1 meq/L (ref 3.5–5.3)
Sodium: 138 mEq/L (ref 135–145)

## 2014-12-02 MED ORDER — HYDROCHLOROTHIAZIDE 25 MG PO TABS: 25.0000 mg | ORAL_TABLET | Freq: Every day | ORAL | Status: DC

## 2014-12-02 MED ORDER — PREDNISONE 50 MG PO TABS: 50.0000 mg | ORAL_TABLET | Freq: Every day | ORAL | Status: DC

## 2014-12-02 MED ORDER — POTASSIUM CHLORIDE CRYS ER 10 MEQ PO TBCR: 10.0000 meq | EXTENDED_RELEASE_TABLET | Freq: Every day | ORAL | Status: DC

## 2014-12-02 MED ORDER — HYDROCORTISONE 0.5 % EX CREA: 1.0000 "application " | TOPICAL_CREAM | Freq: Two times a day (BID) | CUTANEOUS | Status: DC | PRN

## 2014-12-02 MED ORDER — IBUPROFEN 800 MG PO TABS: 800.0000 mg | ORAL_TABLET | Freq: Three times a day (TID) | ORAL | Status: DC | PRN

## 2014-12-02 NOTE — Patient Instructions (Signed)
Continue current meds Checking labs today  Take prednisone 50mg  daily for 5 days Also can use hydrocortisone cream for itching Stop wearing the clothing item that has caused the rash If rash not improving in the next week or so, please return to be seen (or if you have fevers, headaches, etc)  Be well, Dr. Pollie Meyer

## 2014-12-03 ENCOUNTER — Encounter: Payer: Self-pay | Admitting: Family Medicine

## 2014-12-03 DIAGNOSIS — R21 Rash and other nonspecific skin eruption: Secondary | ICD-10-CM | POA: Insufficient documentation

## 2014-12-03 LAB — HIV ANTIBODY (ROUTINE TESTING W REFLEX): HIV: NONREACTIVE

## 2014-12-03 NOTE — Assessment & Plan Note (Signed)
Well controlled. Continue current regimen. Check BMET today. 

## 2014-12-03 NOTE — Progress Notes (Signed)
Patient ID: Kathryn Grant, female   DOB: April 21, 1966, 49 y.o.   MRN: [id number]  HPI:  HTN: currently taking HCTZ and potassium. Tolerating these well, no concerns. Denies CP, SOB, swelling.  Rash: recently bought a new item of clothing (a "housedress") and has had a rash after wearing it. Has had rash for at least several weeks. The rash is itchy. No fevers, new medicines or foods, sores in mouth or genitals. No headache associated with the rash. Lives with mother who does not have the rash. Not sure what kind of material the dress is made of but she has continued to wear it.  Ibuprofen rx: requests ibuprofen 800mg  rx for when she has occasional headaches. Uses this about once per month.  ROS: See HPI.  PMFSH: HTN  PHYSICAL EXAM: BP 114/65 mmHg  Pulse 79  Temp(Src) 98 F (36.7 C) (Oral)  Ht 5\' 6"  (1.676 m)  Wt 195 lb 12.8 oz (88.814 kg)  BMI 31.62 kg/m2 Gen: NAD HEENT: NCAT, MMM, no oral lesions Heart: RRR  No murmur Lungs: CTAB NWOB Neuro: grossly nonfocal, speech normal Ext: No appreciable lower extremity edema bilaterally  Skin: scattered maculopapular rash on arms and legs  ASSESSMENT/PLAN:  Health maintenance:  -draw HIV antibody today for routine one-time screening  Hypertension Well controlled. Continue current regimen. Check BMET today.  Rash and nonspecific skin eruption Likely some form of contact dermatitis from irritating clothing vs allergic rxn to specific material. No red flags. Will rx hydrocortisone cream for topical use, and prednisone 5 day course to calm rash down as pt is quite frustrated with rash presently. F/u if not improving.   FOLLOW UP: F/u in one year for HTN  [place] J. Pollie Meyer, MD [facility]

## 2014-12-03 NOTE — Assessment & Plan Note (Signed)
Likely some form of contact dermatitis from irritating clothing vs allergic rxn to specific material. No red flags. Will rx hydrocortisone cream for topical use, and prednisone 5 day course to calm rash down as pt is quite frustrated with rash presently. F/u if not improving.

## 2015-08-04 ENCOUNTER — Other Ambulatory Visit: Payer: Self-pay

## 2015-08-04 DIAGNOSIS — Z1231 Encounter for screening mammogram for malignant neoplasm of breast: Secondary | ICD-10-CM

## 2015-08-17 ENCOUNTER — Ambulatory Visit
Admission: RE | Admit: 2015-08-17 | Discharge: 2015-08-17 | Disposition: A | Discharge status: Home / Self Care | Payer: BLUE CROSS/BLUE SHIELD | Source: Ambulatory Visit

## 2015-08-17 DIAGNOSIS — Z1231 Encounter for screening mammogram for malignant neoplasm of breast: Secondary | ICD-10-CM

## 2016-01-08 ENCOUNTER — Other Ambulatory Visit: Payer: Self-pay | Admitting: *Deleted

## 2016-01-08 ENCOUNTER — Telehealth: Payer: Self-pay | Admitting: *Deleted

## 2016-01-08 DIAGNOSIS — I10 Essential (primary) hypertension: Secondary | ICD-10-CM

## 2016-01-08 MED ORDER — HYDROCHLOROTHIAZIDE 25 MG PO TABS: 25.0000 mg | ORAL_TABLET | Freq: Every day | ORAL | 0 refills | Status: DC

## 2016-01-08 MED ORDER — POTASSIUM CHLORIDE CRYS ER 10 MEQ PO TBCR: 10.0000 meq | EXTENDED_RELEASE_TABLET | Freq: Every day | ORAL | 0 refills | Status: DC

## 2016-01-08 NOTE — Telephone Encounter (Signed)
Patient requesting refill on hctz, and potassium. [facility] [address].

## 2016-01-08 NOTE — Telephone Encounter (Signed)
Refill completed. F/U with PCP for further refil.

## 2016-01-08 NOTE — Telephone Encounter (Signed)
Patient informed, has appointment with PCP on 8/14.

## 2016-01-09 MED ORDER — POTASSIUM CHLORIDE CRYS ER 10 MEQ PO TBCR: 10.0000 meq | EXTENDED_RELEASE_TABLET | Freq: Every day | ORAL | 0 refills | Status: DC

## 2016-01-09 MED ORDER — HYDROCHLOROTHIAZIDE 25 MG PO TABS: 25.0000 mg | ORAL_TABLET | Freq: Every day | ORAL | 0 refills | Status: DC

## 2016-01-09 NOTE — Telephone Encounter (Signed)
Patient called back and states that scripts were sent to the wrong pharmacy.  Patient wants these to be sent to [facility] [address].  I resent them for her and advised her to check with them in another hour or so. Dakoda Bassette,CMA

## 2016-01-09 NOTE — Addendum Note (Signed)
Addended by: Henri Medal on: 01/09/2016 11:32 AM   Modules accepted: Orders

## 2016-01-29 ENCOUNTER — Encounter: Payer: Self-pay | Admitting: Family Medicine

## 2016-01-29 ENCOUNTER — Ambulatory Visit (INDEPENDENT_AMBULATORY_CARE_PROVIDER_SITE_OTHER): Payer: BLUE CROSS/BLUE SHIELD | Admitting: Family Medicine

## 2016-01-29 VITALS — BP 126/67 | HR 65 | Temp 97.7°F | Ht 66.0 in | Wt 210.2 lb

## 2016-01-29 DIAGNOSIS — I10 Essential (primary) hypertension: Secondary | ICD-10-CM | POA: Diagnosis not present

## 2016-01-29 DIAGNOSIS — R519 Headache, unspecified: Secondary | ICD-10-CM | POA: Insufficient documentation

## 2016-01-29 DIAGNOSIS — R51 Headache: Secondary | ICD-10-CM | POA: Diagnosis not present

## 2016-01-29 MED ORDER — IBUPROFEN 800 MG PO TABS: 800.0000 mg | ORAL_TABLET | Freq: Three times a day (TID) | ORAL | 0 refills | Status: DC | PRN

## 2016-01-29 MED ORDER — HYDROCHLOROTHIAZIDE 25 MG PO TABS: 25.0000 mg | ORAL_TABLET | Freq: Every day | ORAL | 11 refills | Status: DC

## 2016-01-29 MED ORDER — POTASSIUM CHLORIDE CRYS ER 10 MEQ PO TBCR: 10.0000 meq | EXTENDED_RELEASE_TABLET | Freq: Every day | ORAL | 11 refills | Status: DC

## 2016-01-29 NOTE — Assessment & Plan Note (Signed)
Well controlled. Continue current regimen. Check CMET & lipids today. 

## 2016-01-29 NOTE — Patient Instructions (Signed)
See the handout on how to schedule your colonoscopy. This is an important screening test for colon cancer.   Checking labs today: kidneys, liver, cholesterol  Sent in refills on your medications.  See me again in 1 year, sooner if needed.  Be well, Dr. Pollie MeyerMcIntyre

## 2016-01-29 NOTE — Progress Notes (Signed)
Date of Visit: 01/29/2016   HPI:  Patient presents for follow up of hypertension.  Hypertension - taking hctz 25mg  daliy. Also takes [id number] daily. Denies chest pain, shortness of breath, swelling. Does not check blood pressure at home.  Requests rx for ibuprofen 800mg  tablets to use as needed for headaches, this keeps her from having to buy over the counter ibuprofen. Uses less than once per month.  ROS: See HPI.  PMFSH: hypertension  PHYSICAL EXAM: BP 126/67   Pulse 65   Temp 97.7 F (36.5 C) (Oral)   Ht 5\' 6"  (1.676 m)   Wt 210 lb 3.2 oz (95.3 kg)   BMI 33.93 kg/m  Gen: NAD, pleasant, cooperative Heart: RRR Lungs: CTAB, NWOB Neuro: grossly nonfocal, speech intact Ext: No appreciable lower extremity edema bilaterally   ASSESSMENT/PLAN:  Health maintenance:  -given handout on scheduling screening colonoscopy  Hypertension Well controlled. Continue current regimen. Check CMET & lipids today.  Headache Controlled with very occasional 800mg  ibuprofen tablets. Will refill #30, this lasted an entire year last time.  FOLLOW UP: Follow up in 1 year for hypertension Schedule colonoscopy  GrenadaBrittany J. Pollie MeyerMcIntyre, MD [facility]

## 2016-01-29 NOTE — Assessment & Plan Note (Signed)
Controlled with very occasional 800mg  ibuprofen tablets. Will refill #30, this lasted an entire year last time.

## 2016-01-30 LAB — COMPLETE METABOLIC PANEL WITH GFR
ALT: 13 U/L (ref 6–29)
AST: 12 U/L (ref 10–35)
Albumin: 3.7 g/dL (ref 3.6–5.1)
Alkaline Phosphatase: 97 U/L (ref 33–130)
BILIRUBIN TOTAL: 0.4 mg/dL (ref 0.2–1.2)
BUN: 10 mg/dL (ref 7–25)
CO2: 26 mmol/L (ref 20–31)
CREATININE: 0.61 mg/dL (ref 0.50–1.05)
Calcium: 9.3 mg/dL (ref 8.6–10.4)
Chloride: 103 mmol/L (ref 98–110)
GFR, Est Non African American: >89 mL/min (ref >=60)
Glucose, Bld: 80 mg/dL (ref 65–99)
Potassium: 4.1 mmol/L (ref 3.5–5.3)
Sodium: 136 mmol/L (ref 135–146)
TOTAL PROTEIN: 6.9 g/dL (ref 6.1–8.1)

## 2016-01-30 LAB — LIPID PANEL
Cholesterol: 141 mg/dL (ref 125–200)
HDL: 58 mg/dL (ref >=46)
LDL CALC: 74 mg/dL (ref <130)
TRIGLYCERIDES: 45 mg/dL (ref <150)
Total CHOL/HDL Ratio: 2.4 Ratio (ref <=5.0)
VLDL: 9 mg/dL (ref <30)

## 2016-02-02 ENCOUNTER — Encounter: Payer: Self-pay | Admitting: Family Medicine

## 2016-02-06 ENCOUNTER — Telehealth: Payer: Self-pay | Admitting: Family Medicine

## 2016-02-06 NOTE — Telephone Encounter (Signed)
Pt is calling for her lab results. jw °

## 2016-02-07 NOTE — Telephone Encounter (Signed)
Labs were normal. She should be receiving a letter in the mail - this was sent to her. Please inform patient Thanks Latrelle DodrillBrittany J Aneka Fagerstrom, MD

## 2016-02-07 NOTE — Telephone Encounter (Signed)
Pt informed. Lanny Lipkin, CMA  

## 2016-08-22 ENCOUNTER — Other Ambulatory Visit: Payer: Self-pay | Admitting: Family Medicine

## 2016-08-22 DIAGNOSIS — Z1231 Encounter for screening mammogram for malignant neoplasm of breast: Secondary | ICD-10-CM

## 2016-09-13 ENCOUNTER — Ambulatory Visit: Payer: BLUE CROSS/BLUE SHIELD

## 2016-09-19 ENCOUNTER — Ambulatory Visit
Admission: RE | Admit: 2016-09-19 | Discharge: 2016-09-19 | Disposition: A | Discharge status: Home / Self Care | Payer: BLUE CROSS/BLUE SHIELD | Source: Ambulatory Visit | Attending: Family Medicine | Admitting: Family Medicine

## 2016-09-19 DIAGNOSIS — Z1231 Encounter for screening mammogram for malignant neoplasm of breast: Secondary | ICD-10-CM

## 2016-11-22 ENCOUNTER — Encounter: Payer: Self-pay | Admitting: Family Medicine

## 2017-01-02 ENCOUNTER — Other Ambulatory Visit: Payer: Self-pay | Admitting: Family Medicine

## 2017-01-02 DIAGNOSIS — I10 Essential (primary) hypertension: Secondary | ICD-10-CM

## 2017-01-02 NOTE — Telephone Encounter (Signed)
Pt  calling to request refill of:  Name of Medication(s):  Hydrochlorothiazide and potassium Last date of OV: 01-29-16 Pharmacy:  [facility]  Will route refill request to Clinic RN.  Discussed with patient policy to call pharmacy for future refills.  Also, discussed refills may take up to 48 hours to approve or deny.  Markus Jarvismily C Pittman  Pt would like refills just enough to get pt through until next appointment 02/14/17 ep

## 2017-01-03 MED ORDER — POTASSIUM CHLORIDE CRYS ER 10 MEQ PO TBCR: 10.0000 meq | EXTENDED_RELEASE_TABLET | Freq: Every day | ORAL | 3 refills | Status: DC

## 2017-01-03 MED ORDER — HYDROCHLOROTHIAZIDE 25 MG PO TABS: 25.0000 mg | ORAL_TABLET | Freq: Every day | ORAL | 3 refills | Status: DC

## 2017-02-14 ENCOUNTER — Ambulatory Visit (INDEPENDENT_AMBULATORY_CARE_PROVIDER_SITE_OTHER): Payer: BLUE CROSS/BLUE SHIELD | Admitting: Family Medicine

## 2017-02-14 ENCOUNTER — Encounter: Payer: Self-pay | Admitting: Family Medicine

## 2017-02-14 VITALS — BP 122/78 | HR 77 | Temp 98.3°F | Ht 66.0 in | Wt 209.0 lb

## 2017-02-14 DIAGNOSIS — I10 Essential (primary) hypertension: Secondary | ICD-10-CM

## 2017-02-14 DIAGNOSIS — Z862 Personal history of diseases of the blood and blood-forming organs and certain disorders involving the immune mechanism: Secondary | ICD-10-CM | POA: Diagnosis not present

## 2017-02-14 DIAGNOSIS — R519 Headache, unspecified: Secondary | ICD-10-CM

## 2017-02-14 DIAGNOSIS — R011 Cardiac murmur, unspecified: Secondary | ICD-10-CM | POA: Diagnosis not present

## 2017-02-14 DIAGNOSIS — R51 Headache: Secondary | ICD-10-CM | POA: Diagnosis not present

## 2017-02-14 MED ORDER — IBUPROFEN 800 MG PO TABS: 800.0000 mg | ORAL_TABLET | Freq: Every day | ORAL | 0 refills | Status: DC | PRN

## 2017-02-14 MED ORDER — HYDROCHLOROTHIAZIDE 25 MG PO TABS: 25.0000 mg | ORAL_TABLET | Freq: Every day | ORAL | 3 refills | Status: DC

## 2017-02-14 MED ORDER — POTASSIUM CHLORIDE CRYS ER 10 MEQ PO TBCR: 10.0000 meq | EXTENDED_RELEASE_TABLET | Freq: Every day | ORAL | 3 refills | Status: DC

## 2017-02-14 NOTE — Assessment & Plan Note (Signed)
Remains a very occasional headache. Refill ibuprofen 800 mg tablets for use as needed.

## 2017-02-14 NOTE — Progress Notes (Signed)
Date of Visit: 02/14/2017   HPI:  Patient presents for follow-up.  Hypertension: Taking hydrochlorothiazide 25 mg daily. Also takes potassium 10 mEq daily. Denies chest pain or shortness of breath. Her blood pressure has historically been very well controlled on this regimen and I see her about once a year.  Headaches: Also requests refill of ibuprofen. She takes 800 mg when she gets a headache, which is about every 2-3 months. It works well for her. Typically 30 pills last her an entire year.  Health maintenance: Recently had colonoscopy and was advised that she could have another one in 10 years. I do not have a copy of the pathology results, but she did have polyps resected according to the colonoscopy report.  ROS: See HPI.  PMFSH: History of hypertension, occasional headache, heart murmur  PHYSICAL EXAM: BP 122/78   Pulse 77   Temp 98.3 F (36.8 C) (Oral)   Ht 5\' 6"  (1.676 m)   Wt 209 lb (94.8 kg)   SpO2 99%   BMI 33.73 kg/m  Gen: No acute distress, pleasant, cooperative HEENT: normocephalic, atraumatic, moist mucous membranes  Heart: regular rate and rhythm, +early systolic 2/6 murmur loudest at RUSB Lungs: clear to auscultation bilaterally normal work of breathing  Neuro: alert, grossly nonfocal, speech normal Ext: No appreciable lower extremity edema bilaterally   ASSESSMENT/PLAN:  Health maintenance:  -Offered flu shot to patient today, but pt declined.  -We'll request pathology report for polyps resected to confirm she truly needs follow-up colonoscopy in 10 years she reports.  Heart murmur, systolic Murmur auscultated today. Patient is asymptomatic, denies any chest pain, shortness of breath, or dizziness. Benign sounding murmur. We will continue to monitor it. We'll get echo in the future if it worsens or she becomes symptomatic.  Hypertension Well-controlled, continue current regimen. Check CMET and lipids today.  Headache Remains a very occasional headache.  Refill ibuprofen 800 mg tablets for use as needed.  History of anemia Noted on problem list after patient left. Has not had hemoglobin checked in several years. I will call her in ask her to come back for a lab draw to check her hemoglobin.  FOLLOW UP: Follow up in one year for above medical problems.  GrenadaBrittany J. Pollie MeyerMcIntyre, MD [facility]

## 2017-02-14 NOTE — Assessment & Plan Note (Signed)
Noted on problem list after patient left. Has not had hemoglobin checked in several years. I will call her in ask her to come back for a lab draw to check her hemoglobin.

## 2017-02-14 NOTE — Assessment & Plan Note (Signed)
Well-controlled, continue current regimen. Check CMET and lipids today.

## 2017-02-14 NOTE — Patient Instructions (Signed)
Refilled medications  bloodwork today  Follow up in 1 year  Be well, Dr. Pollie MeyerMcIntyre

## 2017-02-14 NOTE — Assessment & Plan Note (Signed)
Murmur auscultated today. Patient is asymptomatic, denies any chest pain, shortness of breath, or dizziness. Benign sounding murmur. We will continue to monitor it. We'll get echo in the future if it worsens or she becomes symptomatic.

## 2017-02-15 LAB — LIPID PANEL
CHOLESTEROL TOTAL: 134 mg/dL (ref 100–199)
Chol/HDL Ratio: 2.6 ratio (ref 0.0–4.4)
HDL: 52 mg/dL (ref >39)
LDL CALC: 73 mg/dL (ref 0–99)
Triglycerides: 46 mg/dL (ref 0–149)
VLDL Cholesterol Cal: 9 mg/dL (ref 5–40)

## 2017-02-15 LAB — CMP14+EGFR
ALK PHOS: 112 IU/L (ref 39–117)
ALT: 13 IU/L (ref 0–32)
AST: 16 IU/L (ref 0–40)
Albumin/Globulin Ratio: 1.6 (ref 1.2–2.2)
Albumin: 4.3 g/dL (ref 3.5–5.5)
BILIRUBIN TOTAL: 0.3 mg/dL (ref 0.0–1.2)
BUN / CREAT RATIO: 15 (ref 9–23)
BUN: 11 mg/dL (ref 6–24)
CO2: 25 mmol/L (ref 20–29)
CREATININE: 0.71 mg/dL (ref 0.57–1.00)
Calcium: 9.8 mg/dL (ref 8.7–10.2)
Chloride: 100 mmol/L (ref 96–106)
GFR calc Af Amer: 114 mL/min/{1.73_m2} (ref >59)
GFR calc non Af Amer: 99 mL/min/{1.73_m2} (ref >59)
GLUCOSE: 90 mg/dL (ref 65–99)
Globulin, Total: 2.7 g/dL (ref 1.5–4.5)
Potassium: 4.9 mmol/L (ref 3.5–5.2)
SODIUM: 138 mmol/L (ref 134–144)
Total Protein: 7 g/dL (ref 6.0–8.5)

## 2017-02-26 ENCOUNTER — Other Ambulatory Visit: Payer: Self-pay | Admitting: Family Medicine

## 2017-02-26 ENCOUNTER — Telehealth: Payer: Self-pay | Admitting: Family Medicine

## 2017-02-26 DIAGNOSIS — Z862 Personal history of diseases of the blood and blood-forming organs and certain disorders involving the immune mechanism: Secondary | ICD-10-CM

## 2017-02-26 NOTE — Telephone Encounter (Signed)
Called patient to inform of normal labs Also discussed having her come back for repeat blood draw to check CBC & iron studies, since it's been several years since we checked those. Patient asymptomatic presently, and is UTD on colonoscopy. She will call back next week some time to schedule a lab visit.  Patient appreciative, all questions answered Latrelle DodrillBrittany J Kearston Putman, MD

## 2017-03-05 ENCOUNTER — Other Ambulatory Visit: Payer: BLUE CROSS/BLUE SHIELD

## 2017-03-05 DIAGNOSIS — Z862 Personal history of diseases of the blood and blood-forming organs and certain disorders involving the immune mechanism: Secondary | ICD-10-CM

## 2017-03-06 LAB — CBC
HEMOGLOBIN: 11 g/dL — AB (ref 11.1–15.9)
Hematocrit: 33.9 % — ABNORMAL LOW (ref 34.0–46.6)
MCH: 19.3 pg — AB (ref 26.6–33.0)
MCHC: 32.4 g/dL (ref 31.5–35.7)
MCV: 59 fL — ABNORMAL LOW (ref 79–97)
Platelets: 345 10*3/uL (ref 150–379)
RBC: 5.71 x10E6/uL — ABNORMAL HIGH (ref 3.77–5.28)
RDW: 16.4 % — AB (ref 12.3–15.4)
WBC: 6.2 10*3/uL (ref 3.4–10.8)

## 2017-03-06 LAB — IRON AND TIBC
Iron Saturation: 20 % (ref 15–55)
Iron: 63 ug/dL (ref 27–159)
TIBC: 316 ug/dL (ref 250–450)
UIBC: 253 ug/dL (ref 131–425)

## 2017-03-06 LAB — FERRITIN: FERRITIN: 137 ng/mL (ref 15–150)

## 2017-03-13 ENCOUNTER — Telehealth: Payer: Self-pay | Admitting: *Deleted

## 2017-03-13 NOTE — Telephone Encounter (Signed)
Patient called in requesting lab results from blood drawn 9/19. Patient made aware that hgb and hct are slightly improved from 4 years ago and CBC is basically stable. All other tests within normal limits. Patient very appreciative. Kinnie Feil, RN, BSN

## 2017-03-27 ENCOUNTER — Telehealth: Payer: Self-pay | Admitting: Family Medicine

## 2017-03-27 ENCOUNTER — Encounter: Payer: Self-pay | Admitting: Family Medicine

## 2017-03-27 NOTE — Telephone Encounter (Signed)
Attempted to reach patient to discuss labs in more detail. No answer, LVm asking her to call back and that I'd be in the office starting Tues of next week. Also am sending letter  Latrelle Dodrill, MD

## 2017-03-31 ENCOUNTER — Encounter: Payer: Self-pay | Admitting: Family Medicine

## 2017-09-03 ENCOUNTER — Other Ambulatory Visit: Payer: Self-pay | Admitting: Family Medicine

## 2017-09-03 DIAGNOSIS — Z1231 Encounter for screening mammogram for malignant neoplasm of breast: Secondary | ICD-10-CM

## 2017-09-23 ENCOUNTER — Ambulatory Visit
Admission: RE | Admit: 2017-09-23 | Discharge: 2017-09-23 | Disposition: A | Discharge status: Home / Self Care | Payer: BLUE CROSS/BLUE SHIELD | Source: Ambulatory Visit | Attending: Family Medicine | Admitting: Family Medicine

## 2017-09-23 DIAGNOSIS — Z1231 Encounter for screening mammogram for malignant neoplasm of breast: Secondary | ICD-10-CM

## 2018-01-16 ENCOUNTER — Ambulatory Visit: Payer: BLUE CROSS/BLUE SHIELD | Admitting: Family Medicine

## 2018-01-16 ENCOUNTER — Encounter: Payer: Self-pay | Admitting: Family Medicine

## 2018-01-16 VITALS — BP 118/75 | HR 75 | Temp 98.3°F | Ht 66.0 in | Wt 216.0 lb

## 2018-01-16 DIAGNOSIS — R519 Headache, unspecified: Secondary | ICD-10-CM

## 2018-01-16 DIAGNOSIS — I10 Essential (primary) hypertension: Secondary | ICD-10-CM

## 2018-01-16 DIAGNOSIS — R51 Headache: Secondary | ICD-10-CM

## 2018-01-16 MED ORDER — IBUPROFEN 800 MG PO TABS: 800.0000 mg | ORAL_TABLET | Freq: Every day | ORAL | 0 refills | Status: DC | PRN

## 2018-01-16 MED ORDER — HYDROCHLOROTHIAZIDE 25 MG PO TABS: 25.0000 mg | ORAL_TABLET | Freq: Every day | ORAL | 3 refills | Status: DC

## 2018-01-16 MED ORDER — POTASSIUM CHLORIDE CRYS ER 10 MEQ PO TBCR: 10.0000 meq | EXTENDED_RELEASE_TABLET | Freq: Every day | ORAL | 3 refills | Status: DC

## 2018-01-16 NOTE — Progress Notes (Signed)
Date of Visit: 01/16/2018   HPI:  Patient presents for routine follow up of hypertension. I see her once a year for this and it is always well controlled. Taking HCTZ 25mg  daily. Tolerating well. Also takes 10meq of potassium daily. Denies chest pain or shortness of breath.   Also requests refill of ibuprofen 800mg  tablets. Uses these sparingly (less than 1 per month) if she gets a headache. Overall feels well.  Patient has no other complaints today.  ROS: See HPI.  PMFSH: history of hypertension, beta thal minor  PHYSICAL EXAM: BP 118/75 (BP Location: Left Arm, Patient Position: Sitting, Cuff Size: Normal)   Pulse 75   Temp 98.3 F (36.8 C) (Oral)   Ht 5\' 6"  (1.676 m)   Wt 216 lb (98 kg)   SpO2 100%   BMI 34.86 kg/m  Gen: no acute distress, pleasant, cooperative, well appearing HEENT: normocephalic, atraumatic  Heart: regular rate and rhythm, no murmur Lungs: clear to auscultation bilaterally normal work of breathing  Neuro: alert, grossly nonfocal, speech normal Ext: No appreciable lower extremity edema bilaterally   ASSESSMENT/PLAN:  Health maintenance:  -current on HM items  Hypertension Well controlled. Continue current regimen. Check BMET today. Refill HCTZ & potassium.  Headache Refill ibuprofen 800mg  tablets for occasional use, less than once per month.  FOLLOW UP: Follow up in 1 year for hypertension, sooner if needed.  GrenadaBrittany J. Pollie MeyerMcIntyre, MD [facility]

## 2018-01-16 NOTE — Patient Instructions (Signed)
Refilled medications. Checking kidney function today. Follow up in 1 year, sooner if needed.  Be well, Dr. Pollie MeyerMcIntyre

## 2018-01-17 LAB — BASIC METABOLIC PANEL
BUN/Creatinine Ratio: 24 — ABNORMAL HIGH (ref 9–23)
BUN: 15 mg/dL (ref 6–24)
CALCIUM: 9.6 mg/dL (ref 8.7–10.2)
CHLORIDE: 101 mmol/L (ref 96–106)
CO2: 26 mmol/L (ref 20–29)
Creatinine, Ser: 0.62 mg/dL (ref 0.57–1.00)
GFR calc non Af Amer: 104 mL/min/{1.73_m2} (ref >59)
GFR, EST AFRICAN AMERICAN: 120 mL/min/{1.73_m2} (ref >59)
GLUCOSE: 86 mg/dL (ref 65–99)
Potassium: 4.4 mmol/L (ref 3.5–5.2)
Sodium: 140 mmol/L (ref 134–144)

## 2018-01-19 NOTE — Assessment & Plan Note (Signed)
Refill ibuprofen 800mg  tablets for occasional use, less than once per month.

## 2018-01-19 NOTE — Assessment & Plan Note (Signed)
Well controlled. Continue current regimen. Check BMET today. Refill HCTZ & potassium.

## 2018-01-21 ENCOUNTER — Encounter: Payer: Self-pay | Admitting: Family Medicine

## 2018-01-28 ENCOUNTER — Telehealth: Payer: Self-pay | Admitting: Family Medicine

## 2018-01-28 NOTE — Telephone Encounter (Signed)
Pt called to check on her results from her last appointment 01/16/18. Pt would like for Dr. Pollie MeyerMcIntyre to call her.

## 2018-01-28 NOTE — Telephone Encounter (Signed)
Called patient. No answer. LVM saying labs were normal, offered for her to call back if she has questions. Latrelle DodrillBrittany J McIntyre, MD

## 2018-01-29 NOTE — Telephone Encounter (Signed)
Pt returned call.  Informed of normal labs.  No other questions. [place], Maryjo RochesterJessica Dawn, CMA

## 2018-12-24 ENCOUNTER — Encounter: Payer: Self-pay | Admitting: Family Medicine

## 2019-02-08 ENCOUNTER — Other Ambulatory Visit: Payer: Self-pay | Admitting: Family Medicine

## 2019-02-08 DIAGNOSIS — I10 Essential (primary) hypertension: Secondary | ICD-10-CM

## 2019-02-10 NOTE — Telephone Encounter (Signed)
Please ask patient to schedule her yearly visit with me to check hypertension & bloodwork Thanks Leeanne Rio, MD

## 2019-02-10 NOTE — Telephone Encounter (Signed)
Spoke to patient and she is not completely out of her medication.  Patient states that she would love to come back to the clinic but that her insurance is no longer accepted here.  Patient has [facility].  Patient is currently still actively looking for a PCP.  Kathryn Grant, [place]

## 2020-01-29 LAB — HM MAMMOGRAPHY

## 2020-05-29 ENCOUNTER — Encounter: Payer: Self-pay | Admitting: Family Medicine

## 2020-07-07 ENCOUNTER — Telehealth: Payer: Self-pay

## 2020-07-07 NOTE — Telephone Encounter (Signed)
Patient calls nurse line requesting accomodation forms to be filled out by PCP. Patient has not been seen since 01/2018. I scheduled patient an apt for 02/08, however patient would rather call back to schedule once she received the accomodation forms.

## 2020-07-11 NOTE — Telephone Encounter (Signed)
Looks like pt already has an appt. Kathryn Grant, CMA

## 2020-07-11 NOTE — Telephone Encounter (Signed)
I have not seen patient in 2.5 years. Cannot fill out any forms for her unless she comes in for an appointment first. Please let her know.  Thanks, Latrelle Dodrill, MD

## 2020-07-25 ENCOUNTER — Ambulatory Visit: Payer: BLUE CROSS/BLUE SHIELD | Admitting: Family Medicine

## 2020-07-27 ENCOUNTER — Ambulatory Visit (INDEPENDENT_AMBULATORY_CARE_PROVIDER_SITE_OTHER): Payer: BLUE CROSS/BLUE SHIELD | Admitting: Family Medicine

## 2020-07-27 ENCOUNTER — Other Ambulatory Visit: Payer: Self-pay

## 2020-07-27 ENCOUNTER — Ambulatory Visit (INDEPENDENT_AMBULATORY_CARE_PROVIDER_SITE_OTHER): Payer: 59

## 2020-07-27 ENCOUNTER — Encounter: Payer: Self-pay | Admitting: Family Medicine

## 2020-07-27 VITALS — BP 127/72 | HR 71 | Wt 198.2 lb

## 2020-07-27 DIAGNOSIS — Z862 Personal history of diseases of the blood and blood-forming organs and certain disorders involving the immune mechanism: Secondary | ICD-10-CM | POA: Diagnosis not present

## 2020-07-27 DIAGNOSIS — I10 Essential (primary) hypertension: Secondary | ICD-10-CM

## 2020-07-27 DIAGNOSIS — M79671 Pain in right foot: Secondary | ICD-10-CM | POA: Diagnosis not present

## 2020-07-27 DIAGNOSIS — Z23 Encounter for immunization: Secondary | ICD-10-CM | POA: Diagnosis not present

## 2020-07-27 DIAGNOSIS — Z1159 Encounter for screening for other viral diseases: Secondary | ICD-10-CM | POA: Diagnosis not present

## 2020-07-27 DIAGNOSIS — M79672 Pain in left foot: Secondary | ICD-10-CM

## 2020-07-27 MED ORDER — HYDROCHLOROTHIAZIDE 25 MG PO TABS: 25.0000 mg | ORAL_TABLET | Freq: Every day | ORAL | 3 refills | Status: DC

## 2020-07-27 MED ORDER — POTASSIUM CHLORIDE CRYS ER 10 MEQ PO TBCR: 10.0000 meq | EXTENDED_RELEASE_TABLET | Freq: Every day | ORAL | 3 refills | Status: DC

## 2020-07-27 NOTE — Assessment & Plan Note (Signed)
Significant plantar warts on bilateral feet Offered referral to podiatry or for [place] to do cryotherapy here She declines both of these at this time Paperwork completed for her to be able to sit in a chair/stool at work - I think this is a very reasonable request.

## 2020-07-27 NOTE — Assessment & Plan Note (Signed)
Update CBC today. 

## 2020-07-27 NOTE — Patient Instructions (Signed)
It was great to see you again today!  Checking labs today Let me know if you want to try treating the warts, or you want to see a foot specialist.  Pfizer booster today  Follow up with me in 1 year for your blood pressure, sooner if needed  Be well, Dr. Pollie Meyer

## 2020-07-27 NOTE — Progress Notes (Signed)
  Date of Visit: 07/27/2020   SUBJECTIVE:   HPI:  Kathryn Grant presents today for routine follow up.  She was not seen here last year as our clinic evidently did not take her insurance. She went to [facility] instead. Had labs drawn there but does not know when.  Hypertension - taking HCTZ 25mg  daily and kdur 10mg  daily. No issues with these medications.  Work - has pain in her bilateral feet. 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 Requests forms completed so she can have a stool or chair to sit on as needed during her shifts. Wears good supportive shoes.  OBJECTIVE:   BP 127/72   Pulse 71   Wt 198 lb 3.2 oz (89.9 kg)   SpO2 99%   BMI 31.99 kg/m  Gen: no acute distress, pleasant, cooperative HEENT: normocephalic, atraumatic  Heart: regular rate and rhythm, no murmur Lungs: clear to auscultation bilaterally, normal work of breathing  Neuro: alert, speech normal, grossly nonfocal. Ext: No appreciable lower extremity edema bilaterally   ASSESSMENT/PLAN:   Health maintenance:  -hep C antibody drawn today -declined flu shot today -COVID booster given today [employer], initial vaccine was J&J)  Pain in both feet Significant plantar warts on bilateral feet Offered referral to podiatry or for Herbalist to do cryotherapy here She declines both of these at this time Paperwork completed for her to be able to sit in a chair/stool at work - I think this is a very reasonable request.  History of anemia Update CBC today  Hypertension Well controlled. Continue current medication regimen. Lipids and CMET obtained today  FOLLOW UP: Follow up in 1 year for hypertension, sooner if needed  Proofreader J. [place], MD [facility]

## 2020-07-27 NOTE — Assessment & Plan Note (Signed)
Well controlled. Continue current medication regimen. Lipids and CMET obtained today

## 2020-07-27 NOTE — Progress Notes (Signed)
   Covid-19 Vaccination Clinic  Name:  Kynsie Falkner    MRN: [id number] DOB: Aug 10, 1965  07/27/2020  Ms. Dedrick was observed post Covid-19 immunization for 15 minutes without incident. She was provided with Vaccine Information Sheet and instruction to access the V-Safe system.   Ms. Stachnik was instructed to call 911 with any severe reactions post vaccine: Marland Kitchen Difficulty breathing  . Swelling of face and throat  . A fast heartbeat  . A bad rash all over body  . Dizziness and weakness

## 2020-07-28 ENCOUNTER — Encounter: Payer: Self-pay | Admitting: Family Medicine

## 2020-07-28 LAB — CBC
Hematocrit: 37.2 % (ref 34.0–46.6)
Hemoglobin: 11.5 g/dL (ref 11.1–15.9)
MCH: 19.6 pg — ABNORMAL LOW (ref 26.6–33.0)
MCHC: 30.9 g/dL — ABNORMAL LOW (ref 31.5–35.7)
MCV: 63 fL — ABNORMAL LOW (ref 79–97)
Platelets: 359 10*3/uL (ref 150–450)
RBC: 5.88 x10E6/uL — ABNORMAL HIGH (ref 3.77–5.28)
RDW: 18.4 % — ABNORMAL HIGH (ref 11.7–15.4)
WBC: 5.9 10*3/uL (ref 3.4–10.8)

## 2020-07-28 LAB — LIPID PANEL
Chol/HDL Ratio: 2.6 ratio (ref 0.0–4.4)
Cholesterol, Total: 146 mg/dL (ref 100–199)
HDL: 56 mg/dL (ref >39)
LDL Chol Calc (NIH): 80 mg/dL (ref 0–99)
Triglycerides: 46 mg/dL (ref 0–149)
VLDL Cholesterol Cal: 10 mg/dL (ref 5–40)

## 2020-07-28 LAB — CMP14+EGFR
ALT: 12 IU/L (ref 0–32)
AST: 11 IU/L (ref 0–40)
Albumin/Globulin Ratio: 1.4 (ref 1.2–2.2)
Albumin: 4.1 g/dL (ref 3.8–4.9)
Alkaline Phosphatase: 123 IU/L — ABNORMAL HIGH (ref 44–121)
BUN/Creatinine Ratio: 17 (ref 9–23)
BUN: 12 mg/dL (ref 6–24)
Bilirubin Total: 0.5 mg/dL (ref 0.0–1.2)
CO2: 23 mmol/L (ref 20–29)
Calcium: 9.8 mg/dL (ref 8.7–10.2)
Chloride: 103 mmol/L (ref 96–106)
Creatinine, Ser: 0.7 mg/dL (ref 0.57–1.00)
GFR calc Af Amer: 114 mL/min/{1.73_m2} (ref >59)
GFR calc non Af Amer: 99 mL/min/{1.73_m2} (ref >59)
Globulin, Total: 2.9 g/dL (ref 1.5–4.5)
Glucose: 79 mg/dL (ref 65–99)
Potassium: 4.2 mmol/L (ref 3.5–5.2)
Sodium: 141 mmol/L (ref 134–144)
Total Protein: 7 g/dL (ref 6.0–8.5)

## 2020-07-28 LAB — HCV INTERPRETATION

## 2020-07-28 LAB — HCV AB W REFLEX TO QUANT PCR: HCV Ab: 0.1 s/co ratio (ref 0.0–0.9)

## 2020-08-08 ENCOUNTER — Telehealth: Payer: Self-pay

## 2020-08-08 NOTE — Telephone Encounter (Signed)
Patient calls nurse line requesting to speak with PCP regarding work restrictions. Patient is requesting that restrictions be lifted due to employer talking about taking her out of work due to restrictions.   Patient states that this is of an urgent nature and would like to speak with PCP as soon as possible.   Veronda Prude, RN

## 2020-08-09 ENCOUNTER — Encounter: Payer: Self-pay | Admitting: Family Medicine

## 2020-08-09 NOTE — Telephone Encounter (Signed)
Returned call to patient She needs a letter written saying she can do two hours of standing and walking, and then take a break. This is what her job is able to offer her.  Will write letter and have it faxed at patient's request  Patient appreciative.  Latrelle Dodrill, MD

## 2020-08-11 ENCOUNTER — Encounter: Payer: Self-pay | Admitting: Family Medicine

## 2020-08-11 DIAGNOSIS — Z90710 Acquired absence of both cervix and uterus: Secondary | ICD-10-CM | POA: Insufficient documentation

## 2020-10-04 ENCOUNTER — Telehealth: Payer: Self-pay

## 2020-10-04 NOTE — Telephone Encounter (Signed)
Patient calls nurse line requesting that medications be changed from capsules to tablets. Per chart review, medications were sent over for tablets on 2/10. Advised patient to contact pharmacy as they may have dispensed capsules if they were out of stock of the tablets.   Patient will contact pharmacist for further clarification of this concern.   Veronda Prude, RN

## 2020-12-22 ENCOUNTER — Other Ambulatory Visit: Payer: Self-pay | Admitting: Family Medicine

## 2020-12-22 DIAGNOSIS — Z1231 Encounter for screening mammogram for malignant neoplasm of breast: Secondary | ICD-10-CM

## 2021-02-14 ENCOUNTER — Ambulatory Visit
Admission: RE | Admit: 2021-02-14 | Discharge: 2021-02-14 | Disposition: A | Discharge status: Home / Self Care | Payer: 59 | Source: Ambulatory Visit | Attending: Family Medicine | Admitting: Family Medicine

## 2021-02-14 ENCOUNTER — Other Ambulatory Visit: Payer: Self-pay | Admitting: Family Medicine

## 2021-02-14 ENCOUNTER — Other Ambulatory Visit: Payer: Self-pay

## 2021-02-14 DIAGNOSIS — Z1231 Encounter for screening mammogram for malignant neoplasm of breast: Secondary | ICD-10-CM

## 2021-08-21 ENCOUNTER — Ambulatory Visit (INDEPENDENT_AMBULATORY_CARE_PROVIDER_SITE_OTHER): Payer: 59 | Admitting: Family Medicine

## 2021-08-21 ENCOUNTER — Encounter: Payer: Self-pay | Admitting: Family Medicine

## 2021-08-21 ENCOUNTER — Other Ambulatory Visit: Payer: Self-pay

## 2021-08-21 VITALS — BP 123/62 | HR 64 | Ht 66.0 in | Wt 212.0 lb

## 2021-08-21 DIAGNOSIS — I10 Essential (primary) hypertension: Secondary | ICD-10-CM

## 2021-08-21 DIAGNOSIS — I872 Venous insufficiency (chronic) (peripheral): Secondary | ICD-10-CM

## 2021-08-21 DIAGNOSIS — Z0000 Encounter for general adult medical examination without abnormal findings: Secondary | ICD-10-CM | POA: Diagnosis not present

## 2021-08-21 DIAGNOSIS — Z862 Personal history of diseases of the blood and blood-forming organs and certain disorders involving the immune mechanism: Secondary | ICD-10-CM

## 2021-08-21 DIAGNOSIS — R600 Localized edema: Secondary | ICD-10-CM | POA: Diagnosis not present

## 2021-08-21 NOTE — Patient Instructions (Addendum)
Try aquaphor or vaseline on your legs (or can try Eucerin) ?Checking labs today - cholesterol and kidneys ?Try some compression hose on your legs ?Follow up if not improving, otherwise in 1 year ? ?Be well, ?Dr. Pollie Meyer  ? ?Health Maintenance, Female ?Adopting a healthy lifestyle and getting preventive care are important in promoting health and wellness. Ask your health care provider about: ?The right schedule for you to have regular tests and exams. ?Things you can do on your own to prevent diseases and keep yourself healthy. ?What should I know about diet, weight, and exercise? ?Eat a healthy diet ? ?Eat a diet that includes plenty of vegetables, fruits, low-fat dairy products, and lean protein. ?Do not eat a lot of foods that are high in solid fats, added sugars, or sodium. ?Maintain a healthy weight ?Body mass index (BMI) is used to identify weight problems. It estimates body fat based on height and weight. Your health care provider can help determine your BMI and help you achieve or maintain a healthy weight. ?Get regular exercise ?Get regular exercise. This is one of the most important things you can do for your health. Most adults should: ?Exercise for at least 150 minutes each week. The exercise should increase your heart rate and make you sweat (moderate-intensity exercise). ?Do strengthening exercises at least twice a week. This is in addition to the moderate-intensity exercise. ?Spend less time sitting. Even light physical activity can be beneficial. ?Watch cholesterol and blood lipids ?Have your blood tested for lipids and cholesterol at 56 years of age, then have this test every 5 years. ?Have your cholesterol levels checked more often if: ?Your lipid or cholesterol levels are high. ?You are older than 56 years of age. ?You are at high risk for heart disease. ?What should I know about cancer screening? ?Depending on your health history and family history, you may need to have cancer screening at  various ages. This may include screening for: ?Breast cancer. ?Cervical cancer. ?Colorectal cancer. ?Skin cancer. ?Lung cancer. ?What should I know about heart disease, diabetes, and high blood pressure? ?Blood pressure and heart disease ?High blood pressure causes heart disease and increases the risk of stroke. This is more likely to develop in people who have high blood pressure readings or are overweight. ?Have your blood pressure checked: ?Every 3-5 years if you are 11-32 years of age. ?Every year if you are 60 years old or older. ?Diabetes ?Have regular diabetes screenings. This checks your fasting blood sugar level. Have the screening done: ?Once every three years after age 69 if you are at a normal weight and have a low risk for diabetes. ?More often and at a younger age if you are overweight or have a high risk for diabetes. ?What should I know about preventing infection? ?Hepatitis B ?If you have a higher risk for hepatitis B, you should be screened for this virus. Talk with your health care provider to find out if you are at risk for hepatitis B infection. ?Hepatitis C ?Testing is recommended for: ?Everyone born from [birth year] through 1965. ?Anyone with known risk factors for hepatitis C. ?Sexually transmitted infections (STIs) ?Get screened for STIs, including gonorrhea and chlamydia, if: ?You are sexually active and are younger than 56 years of age. ?You are older than 56 years of age and your health care provider tells you that you are at risk for this type of infection. ?Your sexual activity has changed since you were last screened, and you are at increased risk  for chlamydia or gonorrhea. Ask your health care provider if you are at risk. ?Ask your health care provider about whether you are at high risk for HIV. Your health care provider may recommend a prescription medicine to help prevent HIV infection. If you choose to take medicine to prevent HIV, you should first get tested for HIV. You should then be  tested every 3 months for as long as you are taking the medicine. ?Pregnancy ?If you are about to stop having your period (premenopausal) and you may become pregnant, seek counseling before you get pregnant. ?Take 400 to 800 micrograms (mcg) of folic acid every day if you become pregnant. ?Ask for birth control (contraception) if you want to prevent pregnancy. ?Osteoporosis and menopause ?Osteoporosis is a disease in which the bones lose minerals and strength with aging. This can result in bone fractures. If you are 98 years old or older, or if you are at risk for osteoporosis and fractures, ask your health care provider if you should: ?Be screened for bone loss. ?Take a calcium or vitamin D supplement to lower your risk of fractures. ?Be given hormone replacement therapy (HRT) to treat symptoms of menopause. ?Follow these instructions at home: ?Alcohol use ?Do not drink alcohol if: ?Your health care provider tells you not to drink. ?You are pregnant, may be pregnant, or are planning to become pregnant. ?If you drink alcohol: ?Limit how much you have to: ?0-1 drink a day. ?Know how much alcohol is in your drink. In the [place] one drink equals one 12 oz bottle of beer (355 mL), one 5 oz glass of wine (148 mL), or one 1? oz glass of hard liquor (44 mL). ?Lifestyle ?Do not use any products that contain nicotine or tobacco. These products include cigarettes, chewing tobacco, and vaping devices, such as e-cigarettes. If you need help quitting, ask your health care provider. ?Do not use street drugs. ?Do not share needles. ?Ask your health care provider for help if you need support or information about quitting drugs. ?General instructions ?Schedule regular health, dental, and eye exams. ?Stay current with your vaccines. ?Tell your health care provider if: ?You often feel depressed. ?You have ever been abused or do not feel safe at home. ?Summary ?Adopting a healthy lifestyle and getting preventive care are important in  promoting health and wellness. ?Follow your health care provider's instructions about healthy diet, exercising, and getting tested or screened for diseases. ?Follow your health care provider's instructions on monitoring your cholesterol and blood pressure. ?This information is not intended to replace advice given to you by your health care provider. Make sure you discuss any questions you have with your health care provider. ?Document Revised: 10/23/2020 Document Reviewed: 10/23/2020 ?Elsevier Patient Education ? [place]. ? ?

## 2021-08-21 NOTE — Progress Notes (Signed)
?  Date of Visit: 08/21/2021  ? ?SUBJECTIVE:  ? ?HPI: ? ?Kathryn Grant presents today for a well woman exam.  ? ?Concerns today: legs, see below ?GYN: history of hysterectomy, denies vaginal bleeding ?Exercise: no regular exercise ?Smoking: no ?Alcohol: no ?Drugs: no ?Mood: pretty good, no concerns ?Dentist: no dentist, recommended twice per year ?Cancers in family:  ?-brother just died of cancer, unknown type, no other family history of cancer ? ?Legs - has dark areas over lower shins and calves. No pain. Does have some chronic leg swelling that comes and goes. Is bothered by appearance of her legs, the dark areas specifically. ? ?Hypertension - taking HCTZ 25mg  daily, tolerating well. Also takes Kdur daily. ? ?OBJECTIVE:  ? ?BP 123/62   Pulse 64   Wt 212 lb (96.2 kg)   SpO2 100%   BMI 34.22 kg/m?  ?Gen: NAD, pleasant, cooperative ?HEENT: NCAT, PERRL, no palpable thyromegaly or anterior cervical lymphadenopathy ?Heart: RRR, no murmurs ?Lungs: CTAB, NWOB ?Abdomen: soft, nontender to palpation ?Neuro: grossly nonfocal, speech normal ? ?Extremities: bilateral lower extremities with ruddy discoloration of anterior shins and posterior calves distally. Challenging to palpate DP pulses bilaterally. 2+ pitting edema bilaterally. ? ?ASSESSMENT/PLAN:  ? ?Health maintenance:  ?-pap smear: not indicated, no cervix s/p hysterectomy ?-mammogram: UTD ?-lipid screening: check today ?-colon cancer screening: due May 2028 ?-immunizations:  ?Flu: declines ?Tdap: UTD ?Shingrix: declines ?COVID: declines, has had J&J x1 dose and pfizer x1 dose ?Pneumovax: not indicated ?-handout given on health maintenance topics ? ?Hypertension ?Well controlled. Continue current medication regimen.  ?Update bmet and lipids today ? ?Venous stasis dermatitis of both lower extremities ?Appearance consistent with venous stasis dermatitis. Challenging to palpate DP pulses in setting of lower extremity edema. ABI's performed in clinic today: L 1.22, R  1.28, both normal. Reassuring against ischemic cause of dark spots on legs. Recommend moisturizing skin and trial of compression stockings. Follow up not improving. ? ?History of anemia ?Given edema of legs and history of anemia, update CBC today ? ?FOLLOW UP: ?Follow up in 1 year for next CPE, sooner if needed ? ?June 2028 J. [place], MD ?[facility]  ?

## 2021-08-22 ENCOUNTER — Encounter: Payer: Self-pay | Admitting: Family Medicine

## 2021-08-22 DIAGNOSIS — I872 Venous insufficiency (chronic) (peripheral): Secondary | ICD-10-CM | POA: Insufficient documentation

## 2021-08-22 LAB — BASIC METABOLIC PANEL
BUN/Creatinine Ratio: 12 (ref 9–23)
BUN: 9 mg/dL (ref 6–24)
CO2: 26 mmol/L (ref 20–29)
Calcium: 9.5 mg/dL (ref 8.7–10.2)
Chloride: 104 mmol/L (ref 96–106)
Creatinine, Ser: 0.74 mg/dL (ref 0.57–1.00)
Glucose: 85 mg/dL (ref 70–99)
Potassium: 5.1 mmol/L (ref 3.5–5.2)
Sodium: 137 mmol/L (ref 134–144)
eGFR: 95 mL/min/{1.73_m2} (ref >59)

## 2021-08-22 LAB — LIPID PANEL
Chol/HDL Ratio: 2.7 ratio (ref 0.0–4.4)
Cholesterol, Total: 142 mg/dL (ref 100–199)
HDL: 52 mg/dL (ref >39)
LDL Chol Calc (NIH): 80 mg/dL (ref 0–99)
Triglycerides: 46 mg/dL (ref 0–149)
VLDL Cholesterol Cal: 10 mg/dL (ref 5–40)

## 2021-08-22 LAB — CBC
Hematocrit: 35.1 % (ref 34.0–46.6)
Hemoglobin: 11.2 g/dL (ref 11.1–15.9)
MCH: 19.5 pg — ABNORMAL LOW (ref 26.6–33.0)
MCHC: 31.9 g/dL (ref 31.5–35.7)
MCV: 61 fL — ABNORMAL LOW (ref 79–97)
Platelets: 339 10*3/uL (ref 150–450)
RBC: 5.73 x10E6/uL — ABNORMAL HIGH (ref 3.77–5.28)
RDW: 16.1 % — ABNORMAL HIGH (ref 11.7–15.4)
WBC: 6 10*3/uL (ref 3.4–10.8)

## 2021-08-22 NOTE — Assessment & Plan Note (Signed)
Appearance consistent with venous stasis dermatitis. Challenging to palpate DP pulses in setting of lower extremity edema. ABI's performed in clinic today: L 1.22, R 1.28, both normal. Reassuring against ischemic cause of dark spots on legs. Recommend moisturizing skin and trial of compression stockings. Follow up not improving. ?

## 2021-08-22 NOTE — Assessment & Plan Note (Signed)
Given edema of legs and history of anemia, update CBC today ?

## 2021-08-22 NOTE — Assessment & Plan Note (Signed)
Well controlled. Continue current medication regimen.  ?Update bmet and lipids today ?

## 2021-08-23 ENCOUNTER — Other Ambulatory Visit: Payer: Self-pay

## 2021-08-23 DIAGNOSIS — I10 Essential (primary) hypertension: Secondary | ICD-10-CM

## 2021-08-23 MED ORDER — POTASSIUM CHLORIDE CRYS ER 10 MEQ PO TBCR: 10.0000 meq | EXTENDED_RELEASE_TABLET | Freq: Every day | ORAL | 3 refills | Status: DC

## 2021-08-23 MED ORDER — HYDROCHLOROTHIAZIDE 25 MG PO TABS: 25.0000 mg | ORAL_TABLET | Freq: Every day | ORAL | 3 refills | Status: DC

## 2022-01-17 ENCOUNTER — Other Ambulatory Visit: Payer: Self-pay | Admitting: Family Medicine

## 2022-01-17 DIAGNOSIS — Z1231 Encounter for screening mammogram for malignant neoplasm of breast: Secondary | ICD-10-CM

## 2022-01-29 IMAGING — MG DIGITAL SCREENING BILAT W/ CAD
4 series · 4 of 4 positions shown · non-contrast
Comparison: Previous exam(s).

CLINICAL DATA: Screening.

EXAM:
DIGITAL SCREENING BILATERAL MAMMOGRAM WITH CAD
TECHNIQUE: Bilateral screening digital craniocaudal and mediolateral oblique
mammograms were obtained. The images were evaluated with
computer-aided detection.

[L MLO]
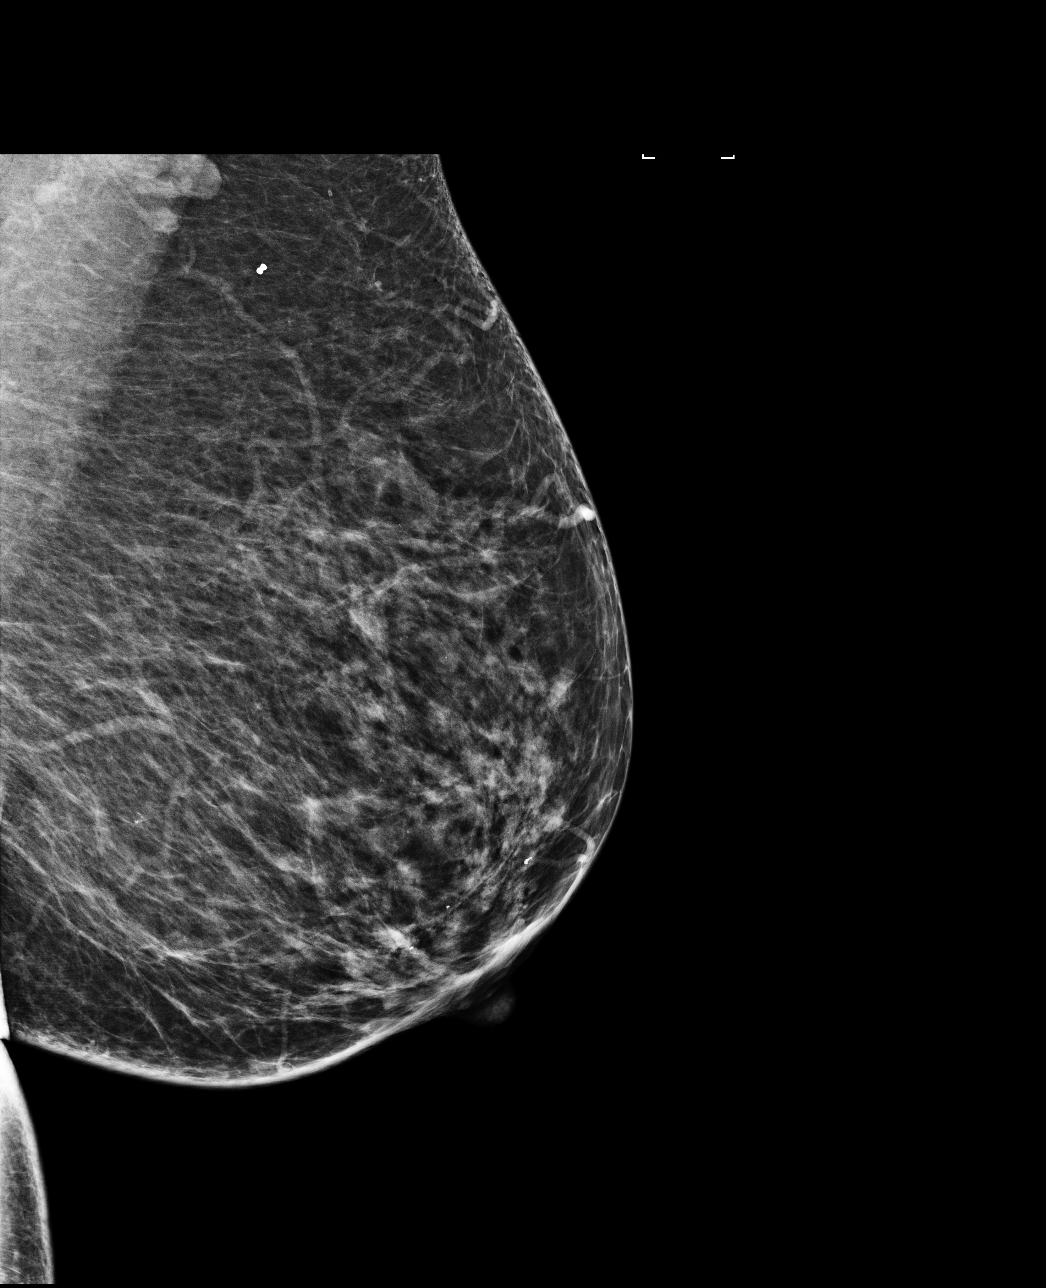

[R MLO]
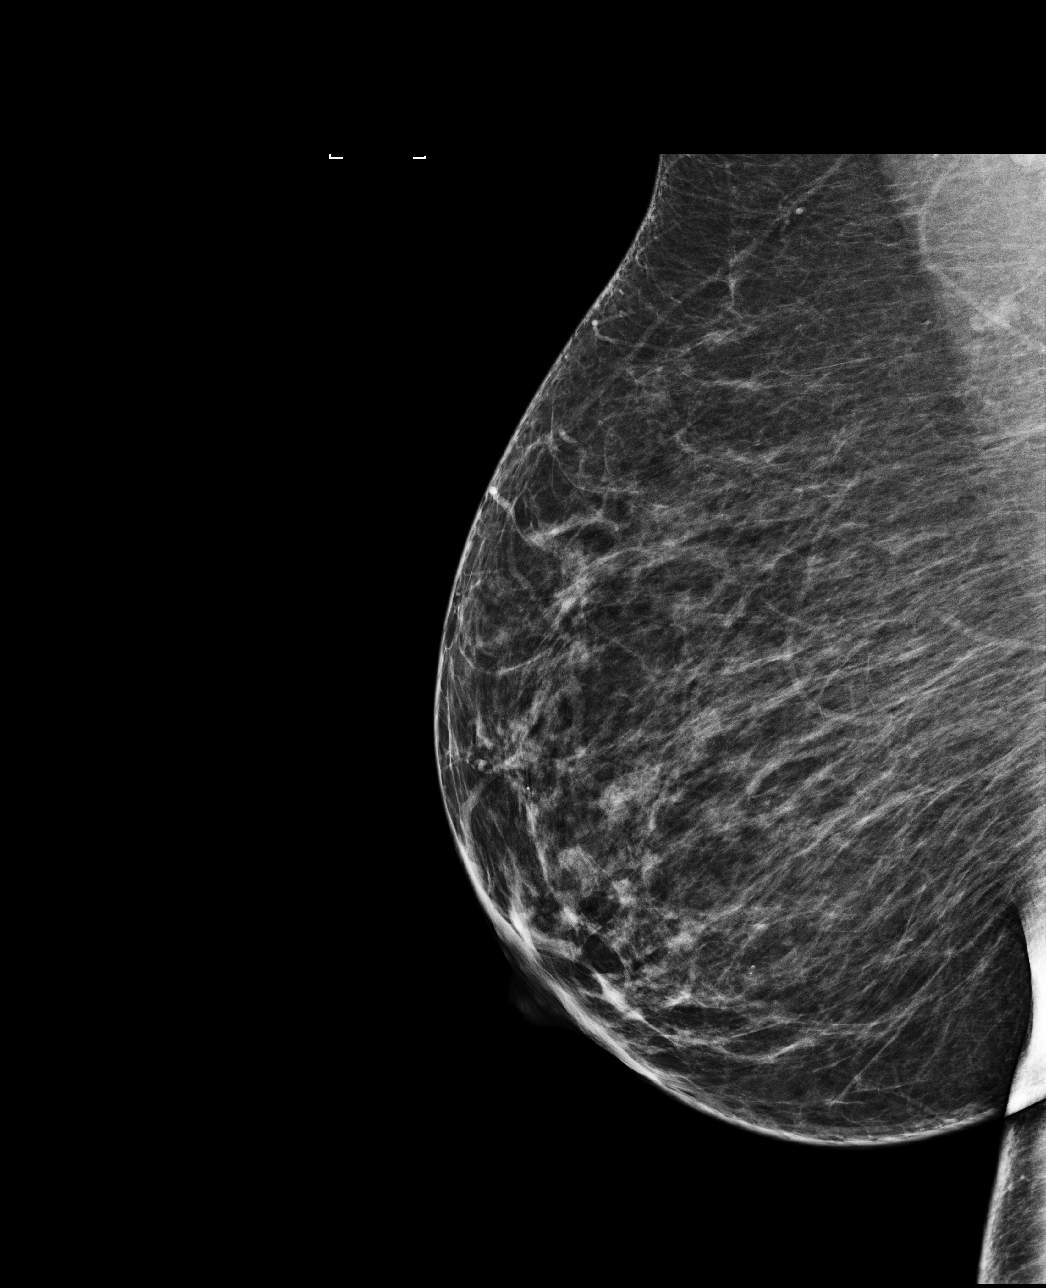

[R CC]
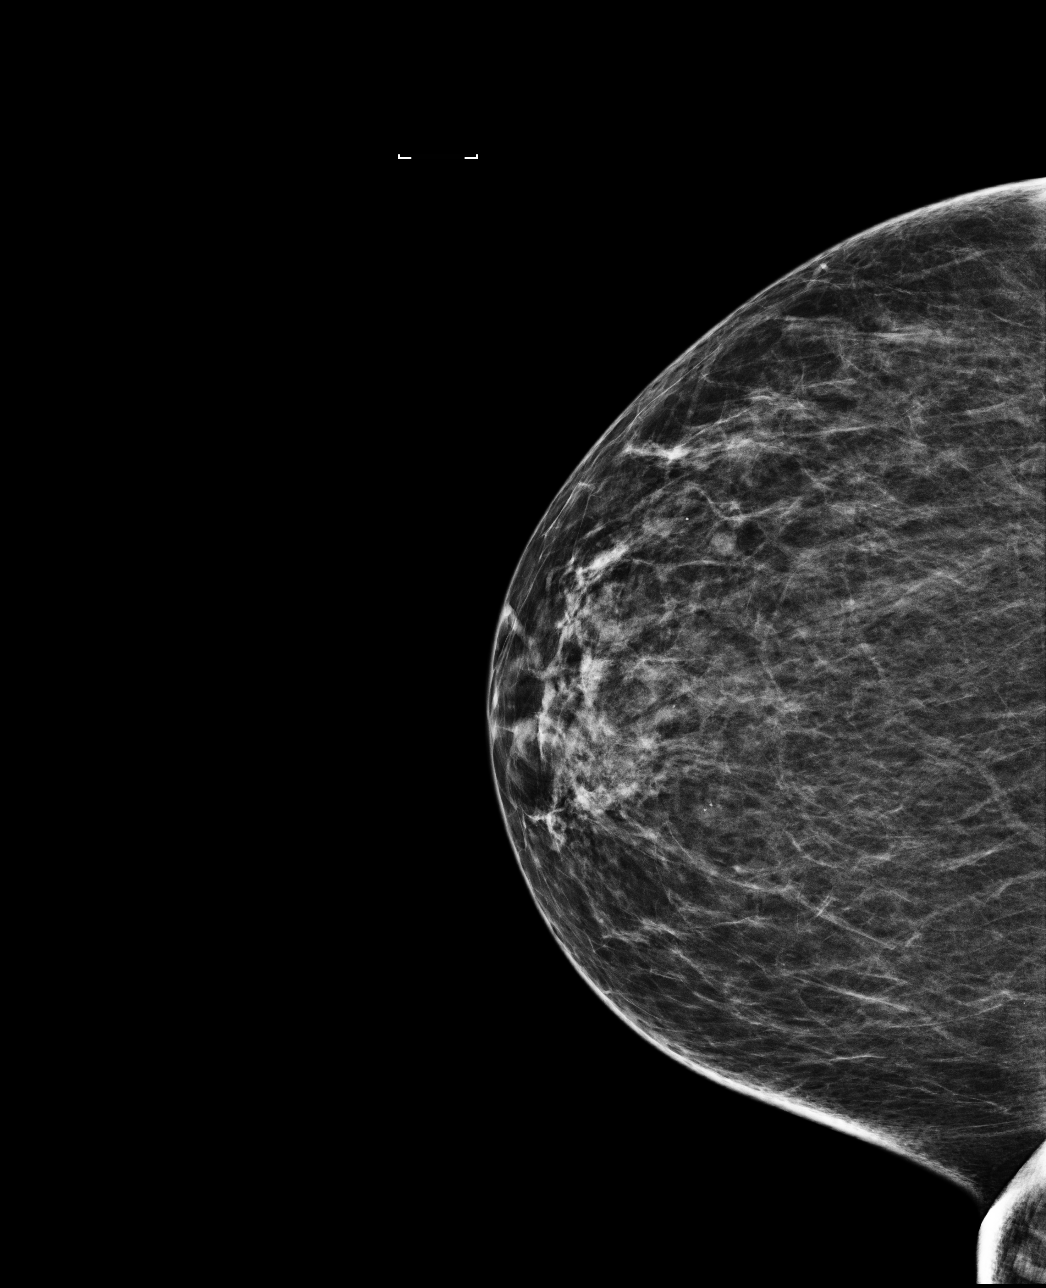

[L CC]
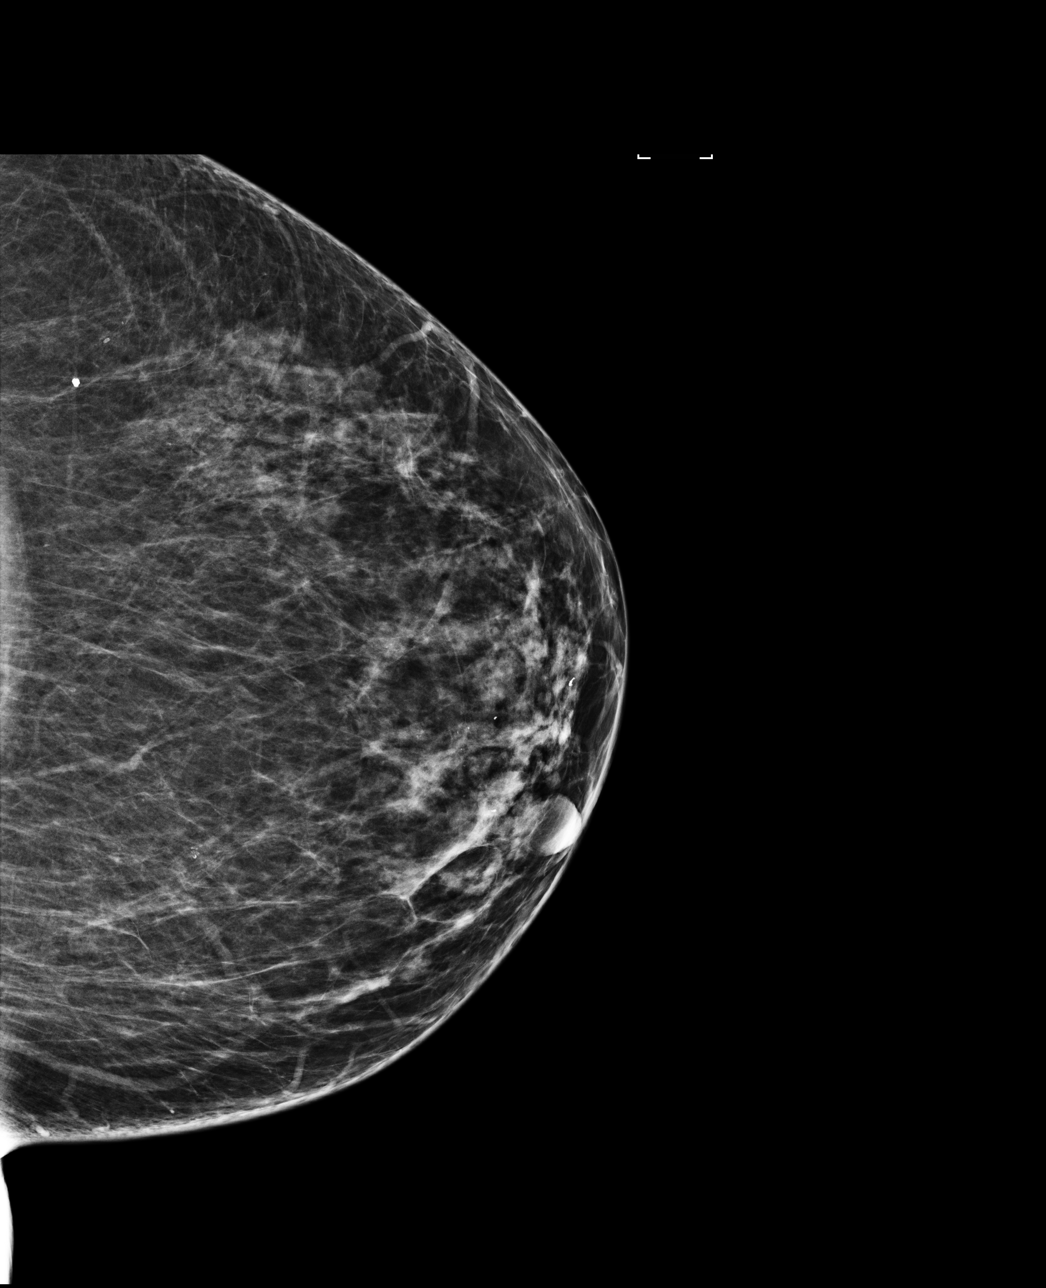

[4 of 4 positions shown; findings below may reference images not displayed]

ACR Breast Density Category b: There are scattered areas of
fibroglandular density.
FINDINGS: There are no findings suspicious for malignancy.
IMPRESSION: No mammographic evidence of malignancy. A result letter of this
screening mammogram will be mailed directly to the patient.

RECOMMENDATION:
Screening mammogram in one year. (Code:[id number])

BI-RADS CATEGORY  1: Negative.

## 2022-02-15 ENCOUNTER — Ambulatory Visit
Admission: RE | Admit: 2022-02-15 | Discharge: 2022-02-15 | Disposition: A | Discharge status: Home / Self Care | Payer: Commercial Managed Care - HMO | Source: Ambulatory Visit | Attending: Family Medicine | Admitting: Family Medicine

## 2022-02-15 DIAGNOSIS — Z1231 Encounter for screening mammogram for malignant neoplasm of breast: Secondary | ICD-10-CM

## 2022-09-12 ENCOUNTER — Other Ambulatory Visit: Payer: Self-pay | Admitting: Family Medicine

## 2022-09-12 ENCOUNTER — Encounter: Payer: Self-pay | Admitting: Family Medicine

## 2022-09-12 ENCOUNTER — Ambulatory Visit (INDEPENDENT_AMBULATORY_CARE_PROVIDER_SITE_OTHER): Payer: BLUE CROSS/BLUE SHIELD | Admitting: Family Medicine

## 2022-09-12 VITALS — BP 118/80 | HR 68 | Ht 66.0 in | Wt 212.0 lb

## 2022-09-12 DIAGNOSIS — Z0000 Encounter for general adult medical examination without abnormal findings: Secondary | ICD-10-CM | POA: Diagnosis not present

## 2022-09-12 DIAGNOSIS — I10 Essential (primary) hypertension: Secondary | ICD-10-CM

## 2022-09-12 DIAGNOSIS — Z1231 Encounter for screening mammogram for malignant neoplasm of breast: Secondary | ICD-10-CM

## 2022-09-12 MED ORDER — POTASSIUM CHLORIDE CRYS ER 10 MEQ PO TBCR: 10.0000 meq | EXTENDED_RELEASE_TABLET | Freq: Every day | ORAL | 3 refills | Status: DC

## 2022-09-12 MED ORDER — HYDROCHLOROTHIAZIDE 25 MG PO TABS: 25.0000 mg | ORAL_TABLET | Freq: Every day | ORAL | 3 refills | Status: DC

## 2022-09-12 NOTE — Patient Instructions (Signed)
It was great to see you again today.  Refilled your medications. Checking lab work today. Follow-up with me in 1 year, sooner if needed  Be well, Dr. Ardelia Mems  Health Maintenance for Postmenopausal Women Menopause is a normal process in which your ability to get pregnant comes to an end. This process happens slowly over many months or years, usually between the ages of 71 and 24. Menopause is complete when you have missed your menstrual period for 12 months. It is important to talk with your health care provider about some of the most common conditions that affect women after menopause (postmenopausal women). These include heart disease, cancer, and bone loss (osteoporosis). Adopting a healthy lifestyle and getting preventive care can help to promote your health and wellness. The actions you take can also lower your chances of developing some of these common conditions. What are the signs and symptoms of menopause? During menopause, you may have the following symptoms: Hot flashes. These can be moderate or severe. Night sweats. Decrease in sex drive. Mood swings. Headaches. Tiredness (fatigue). Irritability. Memory problems. Problems falling asleep or staying asleep. Talk with your health care provider about treatment options for your symptoms. Do I need hormone replacement therapy? Hormone replacement therapy is effective in treating symptoms that are caused by menopause, such as hot flashes and night sweats. Hormone replacement carries certain risks, especially as you become older. If you are thinking about using estrogen or estrogen with progestin, discuss the benefits and risks with your health care provider. How can I reduce my risk for heart disease and stroke? The risk of heart disease, heart attack, and stroke increases as you age. One of the causes may be a change in the body's hormones during menopause. This can affect how your body uses dietary fats, triglycerides, and  cholesterol. Heart attack and stroke are medical emergencies. There are many things that you can do to help prevent heart disease and stroke. Watch your blood pressure High blood pressure causes heart disease and increases the risk of stroke. This is more likely to develop in people who have high blood pressure readings or are overweight. Have your blood pressure checked: Every 3-5 years if you are 28-41 years of age. Every year if you are 59 years old or older. Eat a healthy diet  Eat a diet that includes plenty of vegetables, fruits, low-fat dairy products, and lean protein. Do not eat a lot of foods that are high in solid fats, added sugars, or sodium. Get regular exercise Get regular exercise. This is one of the most important things you can do for your health. Most adults should: Try to exercise for at least 150 minutes each week. The exercise should increase your heart rate and make you sweat (moderate-intensity exercise). Try to do strengthening exercises at least twice each week. Do these in addition to the moderate-intensity exercise. Spend less time sitting. Even light physical activity can be beneficial. Other tips Work with your health care provider to achieve or maintain a healthy weight. Do not use any products that contain nicotine or tobacco. These products include cigarettes, chewing tobacco, and vaping devices, such as e-cigarettes. If you need help quitting, ask your health care provider. Know your numbers. Ask your health care provider to check your cholesterol and your blood sugar (glucose). Continue to have your blood tested as directed by your health care provider. Do I need screening for cancer? Depending on your health history and family history, you may need to have cancer  screenings at different stages of your life. This may include screening for: Breast cancer. Cervical cancer. Lung cancer. Colorectal cancer. What is my risk for osteoporosis? After menopause, you  may be at increased risk for osteoporosis. Osteoporosis is a condition in which bone destruction happens more quickly than new bone creation. To help prevent osteoporosis or the bone fractures that can happen because of osteoporosis, you may take the following actions: If you are 64-1 years old, get at least 1,000 mg of calcium and at least 600 international units (IU) of vitamin D per day. If you are older than age 77 but younger than age 9, get at least 1,200 mg of calcium and at least 600 international units (IU) of vitamin D per day. If you are older than age 40, get at least 1,200 mg of calcium and at least 800 international units (IU) of vitamin D per day. Smoking and drinking excessive alcohol increase the risk of osteoporosis. Eat foods that are rich in calcium and vitamin D, and do weight-bearing exercises several times each week as directed by your health care provider. How does menopause affect my mental health? Depression may occur at any age, but it is more common as you become older. Common symptoms of depression include: Feeling depressed. Changes in sleep patterns. Changes in appetite or eating patterns. Feeling an overall lack of motivation or enjoyment of activities that you previously enjoyed. Frequent crying spells. Talk with your health care provider if you think that you are experiencing any of these symptoms. General instructions See your health care provider for regular wellness exams and vaccines. This may include: Scheduling regular health, dental, and eye exams. Getting and maintaining your vaccines. These include: Influenza vaccine. Get this vaccine each year before the flu season begins. Pneumonia vaccine. Shingles vaccine. Tetanus, diphtheria, and pertussis (Tdap) booster vaccine. Your health care provider may also recommend other immunizations. Tell your health care provider if you have ever been abused or do not feel safe at home. Summary Menopause is a  normal process in which your ability to get pregnant comes to an end. This condition causes hot flashes, night sweats, decreased interest in sex, mood swings, headaches, or lack of sleep. Treatment for this condition may include hormone replacement therapy. Take actions to keep yourself healthy, including exercising regularly, eating a healthy diet, watching your weight, and checking your blood pressure and blood sugar levels. Get screened for cancer and depression. Make sure that you are up to date with all your vaccines. This information is not intended to replace advice given to you by your health care provider. Make sure you discuss any questions you have with your health care provider. Document Revised: 10/23/2020 Document Reviewed: 10/23/2020 Elsevier Patient Education  [place].

## 2022-09-12 NOTE — Progress Notes (Signed)
  Date of Visit: 09/12/2022   SUBJECTIVE:   HPI:  Kathryn Grant presents today for a well woman exam.   Concerns today: none Periods: Hysterectomy Contraception: Hysterectomy Pelvic symptoms: Denies STD Screening: Declines today Pap smear status: Status post hysterectomy Exercise: Walks some Smoking: No Alcohol: No Drugs: No Mood: No concerns Cancers in family: None  Hypertension: Currently taking HCTZ 25 mg daily.  Also takes potassium 10 mill equivalents daily.  Needs refills on these.  OBJECTIVE:   BP 118/80   Pulse 68   Ht 5\' 6"  (1.676 m)   Wt 212 lb (96.2 kg)   SpO2 97%   BMI 34.22 kg/m  Gen: NAD, pleasant, cooperative HEENT: NCAT, PERRL, no palpable thyromegaly or anterior cervical lymphadenopathy Heart: RRR, no murmurs Lungs: CTAB, NWOB Abdomen: soft, nontender to palpation Neuro: grossly nonfocal, speech normal   ASSESSMENT/PLAN:   Health maintenance:  -STD screening: Declines today -pap smear: Not indicated, hysterectomy -mammogram: Due in September -lipid screening: Unremarkable lipids last year, will hold off on rechecking this year as she may have to pay out-of-pocket -colon cancer screening: Up-to-date -lung cancer screening: Not indicated -DEXA: Not indicated -immunizations:  Flu: Declines Tdap: Up-to-date Shingrix: Declines COVID: Declines Pneumovax: Not indicated -handout given on health maintenance topics  Hypertension Well-controlled, continue current regimen.  Update BMET today.  FOLLOW UP: Follow up in 1 year for next CPE  [place] J. Ardelia Mems, [facility]  Note: Discussed with patient that she has [employer], which only applies to [employer].  She is aware she will need to pay out-of-pocket for today's visit.  I gave her the option of canceling the appointment, but she preferred to see me today.

## 2022-09-13 ENCOUNTER — Encounter: Payer: Self-pay | Admitting: Family Medicine

## 2022-09-13 LAB — BASIC METABOLIC PANEL
BUN/Creatinine Ratio: 17 (ref 9–23)
BUN: 10 mg/dL (ref 6–24)
CO2: 25 mmol/L (ref 20–29)
Calcium: 9.8 mg/dL (ref 8.7–10.2)
Chloride: 103 mmol/L (ref 96–106)
Creatinine, Ser: 0.59 mg/dL (ref 0.57–1.00)
Glucose: 81 mg/dL (ref 70–99)
Potassium: 4.5 mmol/L (ref 3.5–5.2)
Sodium: 141 mmol/L (ref 134–144)
eGFR: 106 mL/min/{1.73_m2} (ref >59)

## 2022-09-13 NOTE — Assessment & Plan Note (Signed)
Well-controlled, continue current regimen.  Update BMET today. 

## 2022-11-12 ENCOUNTER — Other Ambulatory Visit: Payer: Self-pay | Admitting: Family Medicine

## 2022-11-12 DIAGNOSIS — I10 Essential (primary) hypertension: Secondary | ICD-10-CM

## 2023-02-21 ENCOUNTER — Ambulatory Visit: Payer: Self-pay

## 2023-03-17 ENCOUNTER — Telehealth: Payer: Self-pay

## 2023-03-17 NOTE — Telephone Encounter (Signed)
Patient calls nurse line requesting to speak with provider regarding results from mammogram. She received mammogram at [facility] in [facility]   Please advise.   Veronda Prude, RN

## 2023-03-18 NOTE — Telephone Encounter (Signed)
Attempted to reach patient to discuss, no answer.  Left HIPAA compliant voicemail offering for her to call back.  Leeanne Rio, MD

## 2023-03-20 NOTE — Telephone Encounter (Signed)
Patient returns call to nurse line.   She reports that she is currently at work now and she will be at work until 6:00 pm.   She states that it is fine for provider to leave results on VM.   Dr. Pollie Meyer-  I can also provide message for patient if she is unable to answer and then calls back. Thanks.   Veronda Prude, RN

## 2023-03-21 NOTE — Telephone Encounter (Signed)
Patient calls nurse line requesting mammogram results.   She apologizes for missing PCP phone call.   She states you may leave a detailed VM.

## 2023-04-07 NOTE — Telephone Encounter (Signed)
Returned call and left vm with normal mammogram, repeat in 1 year Latrelle Dodrill, MD

## 2023-09-25 ENCOUNTER — Encounter: Payer: Self-pay | Admitting: Family Medicine

## 2023-09-25 ENCOUNTER — Ambulatory Visit (INDEPENDENT_AMBULATORY_CARE_PROVIDER_SITE_OTHER): Admitting: Family Medicine

## 2023-09-25 VITALS — BP 127/67 | HR 67 | Ht 66.0 in | Wt 202.4 lb

## 2023-09-25 DIAGNOSIS — I10 Essential (primary) hypertension: Secondary | ICD-10-CM

## 2023-09-25 DIAGNOSIS — Z0000 Encounter for general adult medical examination without abnormal findings: Secondary | ICD-10-CM | POA: Diagnosis not present

## 2023-09-25 DIAGNOSIS — H6123 Impacted cerumen, bilateral: Secondary | ICD-10-CM | POA: Diagnosis not present

## 2023-09-25 NOTE — Progress Notes (Signed)
  Date of Visit: 09/25/2023   SUBJECTIVE:   HPI:  Kathryn Grant presents today for a well woman exam.   Concerns today: ears are clogged Periods: none, hysterectomy STD Screening: declines today Pap smear status: s/p removal of cervix Exercise: stays active Diet: tries to eat healthy Smoking: no Alcohol: no Drugs: no Mood: good, no issues Dentist: does not have, counseled on twice yearly dental visits Cancers in family: none  OBJECTIVE:   BP 127/67   Pulse 67   Ht 5\' 6"  (1.676 m)   Wt 202 lb 6.4 oz (91.8 kg)   SpO2 99%   BMI 32.67 kg/m  Gen: NAD, pleasant, cooperative HEENT: NCAT, no palpable thyromegaly or anterior cervical lymphadenopathy. + densely impacted cerumen bilaterally, unable to curette much out or lavage out Heart: RRR, no murmurs Lungs: CTAB, NWOB Abdomen: soft, nontender to palpation Neuro: grossly nonfocal, speech normal  ASSESSMENT/PLAN:   Assessment & Plan Routine adult health maintenance -STD screening: declines -pap smear: no longer indicated s/p hysterectomy -mammogram: UTD -lipid screening: update lipids today -colon cancer screening: UTD -lung cancer screening: n/a -immunizations:  Flu: out of flu season Tdap: UTD Shingrix: declines COVID: declines -handout given on health maintenance topics Essential hypertension Well controlled. Continue current medication regimen.  Check BMP and lipids today Bilateral impacted cerumen Unable to remove today with curette or lavage Advised to get debrox drops, use for several weeks then return for reattempt at cerumen removal  FOLLOW UP: Follow up in several weeks for cerumen removal Next CPE in 1 year  [place] J. Dawn Eth, MD [facility]

## 2023-09-25 NOTE — Patient Instructions (Signed)
 It was great to see you again today.  Checking labs today  Get debrox and use in your ears - this will help soften wax so we can remove it. Return after using it for several weeks.    Be well, Dr. Pollie Meyer

## 2023-09-26 ENCOUNTER — Encounter: Payer: Self-pay | Admitting: Family Medicine

## 2023-09-26 LAB — BASIC METABOLIC PANEL WITH GFR
BUN/Creatinine Ratio: 20 (ref 9–23)
BUN: 12 mg/dL (ref 6–24)
CO2: 25 mmol/L (ref 20–29)
Calcium: 9.9 mg/dL (ref 8.7–10.2)
Chloride: 102 mmol/L (ref 96–106)
Creatinine, Ser: 0.59 mg/dL (ref 0.57–1.00)
Glucose: 72 mg/dL (ref 70–99)
Potassium: 5 mmol/L (ref 3.5–5.2)
Sodium: 140 mmol/L (ref 134–144)
eGFR: 105 mL/min/{1.73_m2} (ref >59)

## 2023-09-26 LAB — LIPID PANEL
Chol/HDL Ratio: 2.5 ratio (ref 0.0–4.4)
Cholesterol, Total: 136 mg/dL (ref 100–199)
HDL: 55 mg/dL (ref >39)
LDL Chol Calc (NIH): 71 mg/dL (ref 0–99)
Triglycerides: 41 mg/dL (ref 0–149)
VLDL Cholesterol Cal: 10 mg/dL (ref 5–40)

## 2023-09-27 DIAGNOSIS — Z0000 Encounter for general adult medical examination without abnormal findings: Secondary | ICD-10-CM | POA: Insufficient documentation

## 2023-09-27 NOTE — Assessment & Plan Note (Signed)
-  STD screening: declines -pap smear: no longer indicated s/p hysterectomy -mammogram: UTD -lipid screening: update lipids today -colon cancer screening: UTD -lung cancer screening: n/a -immunizations:  Flu: out of flu season Tdap: UTD Shingrix: declines COVID: declines -handout given on health maintenance topics

## 2023-11-13 ENCOUNTER — Ambulatory Visit: Admitting: Family Medicine

## 2023-11-13 ENCOUNTER — Encounter: Payer: Self-pay | Admitting: Family Medicine

## 2023-11-13 VITALS — BP 115/64 | HR 70 | Ht 66.0 in | Wt 205.4 lb

## 2023-11-13 DIAGNOSIS — I10 Essential (primary) hypertension: Secondary | ICD-10-CM

## 2023-11-13 DIAGNOSIS — H6123 Impacted cerumen, bilateral: Secondary | ICD-10-CM

## 2023-11-13 NOTE — Progress Notes (Signed)
  Date of Visit: 11/13/2023   SUBJECTIVE:   HPI:  Karl presents today for follow up of cerumen impaction.  Has been using debrox regularly. Thinks wax has since softened. Has not gotten much out of ears. Does not use Qtips.   OBJECTIVE:   BP 115/64   Pulse 70   Ht 5\' 6"  (1.676 m)   Wt 205 lb 6.4 oz (93.2 kg)   SpO2 100%   BMI 33.15 kg/m  Gen: no acute distress, pleasant, cooperative, well appearing HEENT: impacted cerumen present bilateral ears. With patient's consent attempted removal with curette as well as lavage. Was able to get moderate amount out of left ear, though still much remained and she could not tolerate further removal. Small amount removed from R ear, with still cerumen remaining.  ASSESSMENT/PLAN:   Assessment & Plan Bilateral impacted cerumen Unable to remove fully today despite repeated attempts Reviewed options with patient, she elects for referral to ENT for further assistance with removal.  Referral placed. Essential hypertension Refilled medications, well controlled    [place] J. Dawn Eth, MD [facility]

## 2023-11-13 NOTE — Patient Instructions (Signed)
 It was great to see you again today.  Referring to ENT for the wax in your ears  Be well, Dr. Arlena Belts, Adult Your ears make something called earwax. It helps keep germs called bacteria away and protects the skin in your ears. Sometimes, too much earwax can build up. This can cause discomfort or make it harder to hear. What are the causes? Earwax buildup can happen when you have too much earwax in your ears. Earwax is made in the outer part of your ear canal. It's supposed to fall out in small amounts over time. But if your ears aren't able to clean themselves like they should, earwax can build up. What increases the risk? You're more likely to get earwax buildup if: You clean your ears with cotton swabs. You pick at your ears. You use earplugs or in-ear headphones a lot. You wear hearing aids. You may also be more likely to get it if: You're female. You're older. Your ears naturally make more earwax. You have narrow ear canals or extra hair in your ears. Your earwax is too thick or sticky. You have eczema. You're dehydrated. This means there's not enough fluid in your body. What are the signs or symptoms? Symptoms of earwax buildup include: Not being able to hear as well. A feeling of fullness in your ear. Feeling like your ear is plugged. Fluid coming from your ear. Ear pain or an itchy ear. Ringing in your ear. Coughing or problems with balance. How is this diagnosed? Earwax buildup may be diagnosed based on your symptoms, medical history, and an ear exam. During the exam, your health care provider will look into your ear with a tool called an otoscope. You may also have tests, such as a hearing test. How is this treated? Earwax buildup may be treated by: Using ear drops. Having the earwax removed by a provider. The provider may: Flush the ear with water. Use a tool called a curette that has a loop on the end. Use a suction device. Having surgery. This  may be done in severe cases. Follow these instructions at home:  Cleaning your ears Clean your ears as told by your provider. You can clean the outside of your ears with a washcloth or tissue. Do not overclean your ears. Do not put anything into your ear unless told. This includes cotton swabs. General instructions Take over-the-counter and prescription medicines only as told by your provider. Drink enough fluid to keep your pee (urine) pale yellow. This helps thin the earwax. If you have hearing aids, clean them as told. Keep all follow-up visits. If earwax builds up in your ears often or if you use hearing aids, ask your provider how often you should have your ears cleaned. Contact a health care provider if: Your ear pain gets worse. You have a fever. You have pus, blood, or other fluid coming from your ear. You have hearing loss. You have ringing in your ears that won't go away. You feel like the room is spinning. This is called vertigo. Your symptoms don't get better with treatment. This information is not intended to replace advice given to you by your health care provider. Make sure you discuss any questions you have with your health care provider. Document Revised: 08/15/2022 Document Reviewed: 08/15/2022 Elsevier Patient Education  2024 [employer].

## 2023-11-15 MED ORDER — HYDROCHLOROTHIAZIDE 25 MG PO TABS: 25.0000 mg | ORAL_TABLET | Freq: Every day | ORAL | 3 refills | Status: AC

## 2023-11-15 MED ORDER — POTASSIUM CHLORIDE CRYS ER 10 MEQ PO TBCR: 10.0000 meq | EXTENDED_RELEASE_TABLET | Freq: Every day | ORAL | 3 refills | Status: AC

## 2024-02-27 LAB — HM MAMMOGRAPHY

## 2024-03-09 ENCOUNTER — Encounter: Payer: Self-pay | Admitting: Family Medicine

## 2024-04-20 ENCOUNTER — Telehealth: Payer: Self-pay

## 2024-04-20 ENCOUNTER — Ambulatory Visit: Admitting: Family Medicine

## 2024-04-20 ENCOUNTER — Encounter: Payer: Self-pay | Admitting: Family Medicine

## 2024-04-20 VITALS — BP 127/63 | HR 74 | Wt 203.2 lb

## 2024-04-20 DIAGNOSIS — R220 Localized swelling, mass and lump, head: Secondary | ICD-10-CM | POA: Diagnosis not present

## 2024-04-20 NOTE — Patient Instructions (Signed)
 Thank you for visiting clinic today and allowing us  to participate in your care!  The swelling on your face may be a an allergic response.  Please take a dose of Benadryl before you go to bed tonight.  Please take Tylenol as needed for swelling and pain.  If your swelling worsens or your skin becomes more red, please let us  know.  If you develop any difficulty breathing or swallowing, please go to the emergency room.  Please schedule an appointment as needed with your PCP.  Reach out any time with any questions or concerns you may have - we are here for you!  Damien Cassis, MD [facility] [phone]

## 2024-04-20 NOTE — Telephone Encounter (Signed)
 Patient calls nurse line requesting an apt with PCP.   She reports her left eye and lips are swollen. She reports symptoms started yesterday.   She denies any trouble breathing or swallowing. She was speaking in full sentences. No tongue swelling.   She denies any new foods or medications. No new face creams, lotions or detergents.   Advised PCP next available is not until 11/24.  Patient declined to schedule with another provider and reports going to urgent care.

## 2024-04-20 NOTE — Progress Notes (Signed)
    SUBJECTIVE:   CHIEF COMPLAINT / HPI:   Facial swelling Patient presenting today for facial swelling.  Upon waking up yesterday, patient noticed redness and swelling below her left eye as well as her cheek and lips.  No difficulty breathing or swallowing.  No new exposures to allergens, skin products, soaps, lotions that she is aware of.  Does note she had a small bump on the left side of her face sometime ago, which she popped.  The area feels puffy, but is not itchy.  No fevers.  No cough or congestion.  No nausea vomiting or diarrhea.  She feels that the swelling has improved since yesterday but is still present.  She has iced the area.  Has not taken any medication for it.  No vision changes.  No eye pain.  No eye drainage.   OBJECTIVE:   BP 127/63   Pulse 74   Wt 203 lb 3.2 oz (92.2 kg)   SpO2 99%   BMI 32.80 kg/m   General: Resting comfortably in room. CV: Normal S1/S2. No extra heart sounds. Warm and well-perfused. Pulm: Breathing comfortably on room air. CTAB.  No wheezing.  No increased WOB. Abd: Soft, non-tender, non-distended. Skin: Area of erythema and soft swelling beneath left thigh and left cheek without induration or increased warmth.  Small 1 mm region of excoriated skin above left mandible without active drainage or bleeding. Psych: Pleasant and appropriate.    ASSESSMENT/PLAN:   Assessment & Plan Left facial swelling Unclear etiology.  Fortunately no signs of anaphylaxis.  Suspect possible local response to environmental allergen versus superficial infection/inflammation secondary to skin breakdown from previously popped bump, now excoriated.  Patient vitals stable without fever.Through shared decision making, discussed plan to return home with plan for taking a Benadryl tonight along with Tylenol as needed for pain/inflammation.  Discussed returning to clinic for worsening symptoms or developing infectious signs.  Discussed ED precautions for signs of  anaphylaxis.   RTC as needed.  Damien Cassis, MD [facility]
